# Patient Record
Sex: Female | Born: 1955 | Race: Black or African American | Hispanic: No | Marital: Married | Smoking: Former smoker
Health system: Southern US, Community
[De-identification: ages and names within clinical notes are randomized; demographics above are authoritative.]

## PROBLEM LIST (undated history)

## (undated) DIAGNOSIS — I10 Essential (primary) hypertension: Secondary | ICD-10-CM

## (undated) DIAGNOSIS — K219 Gastro-esophageal reflux disease without esophagitis: Secondary | ICD-10-CM

## (undated) DIAGNOSIS — C50919 Malignant neoplasm of unspecified site of unspecified female breast: Secondary | ICD-10-CM

## (undated) DIAGNOSIS — F419 Anxiety disorder, unspecified: Secondary | ICD-10-CM

## (undated) DIAGNOSIS — C649 Malignant neoplasm of unspecified kidney, except renal pelvis: Secondary | ICD-10-CM

## (undated) HISTORY — DX: Essential (primary) hypertension: I10

## (undated) HISTORY — DX: Malignant neoplasm of unspecified site of unspecified female breast: C50.919

## (undated) HISTORY — DX: Malignant neoplasm of unspecified kidney, except renal pelvis: C64.9

## (undated) HISTORY — PX: ABDOMINAL HYSTERECTOMY: SHX81

---

## 1997-07-26 ENCOUNTER — Other Ambulatory Visit: Admission: RE | Admit: 1997-07-26 | Discharge: 1997-07-26 | Payer: Self-pay | Admitting: Obstetrics and Gynecology

## 1997-08-05 ENCOUNTER — Emergency Department (HOSPITAL_COMMUNITY): Admission: EM | Admit: 1997-08-05 | Discharge: 1997-08-05 | Payer: Self-pay | Admitting: Emergency Medicine

## 1998-01-23 ENCOUNTER — Ambulatory Visit (HOSPITAL_COMMUNITY): Admission: RE | Admit: 1998-01-23 | Discharge: 1998-01-23 | Payer: Self-pay | Admitting: General Surgery

## 1998-01-23 ENCOUNTER — Encounter (HOSPITAL_BASED_OUTPATIENT_CLINIC_OR_DEPARTMENT_OTHER): Payer: Self-pay | Admitting: General Surgery

## 1998-02-01 ENCOUNTER — Ambulatory Visit (HOSPITAL_COMMUNITY): Admission: RE | Admit: 1998-02-01 | Discharge: 1998-02-01 | Payer: Self-pay | Admitting: General Surgery

## 1998-02-01 ENCOUNTER — Encounter (HOSPITAL_BASED_OUTPATIENT_CLINIC_OR_DEPARTMENT_OTHER): Payer: Self-pay | Admitting: General Surgery

## 1998-02-12 ENCOUNTER — Ambulatory Visit (HOSPITAL_COMMUNITY): Admission: RE | Admit: 1998-02-12 | Discharge: 1998-02-12 | Payer: Self-pay | Admitting: General Surgery

## 1998-02-12 ENCOUNTER — Encounter (HOSPITAL_BASED_OUTPATIENT_CLINIC_OR_DEPARTMENT_OTHER): Payer: Self-pay | Admitting: General Surgery

## 1998-02-23 DIAGNOSIS — C50919 Malignant neoplasm of unspecified site of unspecified female breast: Secondary | ICD-10-CM

## 1998-02-23 HISTORY — DX: Malignant neoplasm of unspecified site of unspecified female breast: C50.919

## 1998-02-23 HISTORY — PX: BREAST LUMPECTOMY: SHX2

## 1998-03-04 ENCOUNTER — Encounter (HOSPITAL_BASED_OUTPATIENT_CLINIC_OR_DEPARTMENT_OTHER): Payer: Self-pay | Admitting: General Surgery

## 1998-03-06 ENCOUNTER — Encounter (HOSPITAL_BASED_OUTPATIENT_CLINIC_OR_DEPARTMENT_OTHER): Payer: Self-pay | Admitting: General Surgery

## 1998-03-06 ENCOUNTER — Ambulatory Visit (HOSPITAL_COMMUNITY): Admission: RE | Admit: 1998-03-06 | Discharge: 1998-03-06 | Payer: Self-pay | Admitting: General Surgery

## 1998-03-27 ENCOUNTER — Encounter: Admission: RE | Admit: 1998-03-27 | Discharge: 1998-06-25 | Payer: Self-pay | Admitting: *Deleted

## 1998-06-17 ENCOUNTER — Inpatient Hospital Stay (HOSPITAL_COMMUNITY): Admission: RE | Admit: 1998-06-17 | Discharge: 1998-06-19 | Payer: Self-pay | Admitting: Obstetrics and Gynecology

## 1998-07-19 ENCOUNTER — Emergency Department (HOSPITAL_COMMUNITY): Admission: EM | Admit: 1998-07-19 | Discharge: 1998-07-19 | Payer: Self-pay | Admitting: Emergency Medicine

## 1998-07-19 ENCOUNTER — Encounter: Payer: Self-pay | Admitting: Emergency Medicine

## 1998-09-30 ENCOUNTER — Ambulatory Visit (HOSPITAL_COMMUNITY): Admission: RE | Admit: 1998-09-30 | Discharge: 1998-09-30 | Payer: Self-pay | Admitting: Family Medicine

## 1998-09-30 ENCOUNTER — Encounter: Payer: Self-pay | Admitting: Family Medicine

## 1998-10-07 ENCOUNTER — Emergency Department (HOSPITAL_COMMUNITY): Admission: EM | Admit: 1998-10-07 | Discharge: 1998-10-07 | Payer: Self-pay | Admitting: Emergency Medicine

## 1999-01-09 ENCOUNTER — Ambulatory Visit (HOSPITAL_COMMUNITY): Admission: RE | Admit: 1999-01-09 | Discharge: 1999-01-09 | Payer: Self-pay | Admitting: Family Medicine

## 1999-01-09 ENCOUNTER — Encounter: Payer: Self-pay | Admitting: Family Medicine

## 1999-02-27 ENCOUNTER — Encounter: Payer: Self-pay | Admitting: Hematology and Oncology

## 1999-02-27 ENCOUNTER — Encounter: Admission: RE | Admit: 1999-02-27 | Discharge: 1999-02-27 | Payer: Self-pay | Admitting: Hematology and Oncology

## 1999-05-21 ENCOUNTER — Emergency Department (HOSPITAL_COMMUNITY): Admission: EM | Admit: 1999-05-21 | Discharge: 1999-05-21 | Payer: Self-pay | Admitting: *Deleted

## 1999-06-12 ENCOUNTER — Emergency Department (HOSPITAL_COMMUNITY): Admission: EM | Admit: 1999-06-12 | Discharge: 1999-06-12 | Payer: Self-pay | Admitting: Emergency Medicine

## 1999-06-26 ENCOUNTER — Encounter: Admission: RE | Admit: 1999-06-26 | Discharge: 1999-06-26 | Payer: Self-pay | Admitting: Hematology and Oncology

## 1999-06-26 ENCOUNTER — Encounter: Payer: Self-pay | Admitting: Hematology and Oncology

## 1999-07-30 ENCOUNTER — Encounter: Payer: Self-pay | Admitting: Emergency Medicine

## 1999-07-30 ENCOUNTER — Emergency Department (HOSPITAL_COMMUNITY): Admission: EM | Admit: 1999-07-30 | Discharge: 1999-07-30 | Payer: Self-pay | Admitting: Emergency Medicine

## 2000-02-05 ENCOUNTER — Encounter: Admission: RE | Admit: 2000-02-05 | Discharge: 2000-02-05 | Payer: Self-pay | Admitting: Hematology and Oncology

## 2000-02-05 ENCOUNTER — Encounter: Payer: Self-pay | Admitting: Hematology and Oncology

## 2000-07-05 ENCOUNTER — Encounter: Payer: Self-pay | Admitting: Emergency Medicine

## 2000-07-05 ENCOUNTER — Emergency Department (HOSPITAL_COMMUNITY): Admission: EM | Admit: 2000-07-05 | Discharge: 2000-07-06 | Payer: Self-pay | Admitting: Emergency Medicine

## 2000-07-06 ENCOUNTER — Other Ambulatory Visit: Admission: RE | Admit: 2000-07-06 | Discharge: 2000-07-06 | Payer: Self-pay | Admitting: Diagnostic Radiology

## 2000-07-06 ENCOUNTER — Encounter: Admission: RE | Admit: 2000-07-06 | Discharge: 2000-07-06 | Payer: Self-pay | Admitting: Family Medicine

## 2000-07-06 ENCOUNTER — Encounter: Payer: Self-pay | Admitting: Family Medicine

## 2000-07-06 ENCOUNTER — Encounter (INDEPENDENT_AMBULATORY_CARE_PROVIDER_SITE_OTHER): Payer: Self-pay | Admitting: Specialist

## 2000-08-06 ENCOUNTER — Other Ambulatory Visit: Admission: RE | Admit: 2000-08-06 | Discharge: 2000-08-06 | Payer: Self-pay | Admitting: Obstetrics and Gynecology

## 2000-08-10 ENCOUNTER — Encounter: Admission: RE | Admit: 2000-08-10 | Discharge: 2000-08-10 | Payer: Self-pay | Admitting: Family Medicine

## 2000-08-10 ENCOUNTER — Encounter: Payer: Self-pay | Admitting: Family Medicine

## 2000-11-05 ENCOUNTER — Emergency Department (HOSPITAL_COMMUNITY): Admission: EM | Admit: 2000-11-05 | Discharge: 2000-11-06 | Payer: Self-pay | Admitting: *Deleted

## 2001-01-14 ENCOUNTER — Encounter: Admission: RE | Admit: 2001-01-14 | Discharge: 2001-01-14 | Payer: Self-pay | Admitting: Hematology and Oncology

## 2001-01-14 ENCOUNTER — Encounter: Payer: Self-pay | Admitting: Hematology and Oncology

## 2001-10-14 ENCOUNTER — Other Ambulatory Visit: Admission: RE | Admit: 2001-10-14 | Discharge: 2001-10-14 | Payer: Self-pay | Admitting: Obstetrics and Gynecology

## 2002-01-16 ENCOUNTER — Encounter: Admission: RE | Admit: 2002-01-16 | Discharge: 2002-01-16 | Payer: Self-pay | Admitting: Family Medicine

## 2002-01-16 ENCOUNTER — Encounter: Payer: Self-pay | Admitting: Family Medicine

## 2002-03-03 ENCOUNTER — Encounter: Payer: Self-pay | Admitting: Family Medicine

## 2002-03-03 ENCOUNTER — Encounter: Admission: RE | Admit: 2002-03-03 | Discharge: 2002-03-03 | Payer: Self-pay | Admitting: Family Medicine

## 2002-06-23 ENCOUNTER — Encounter: Payer: Self-pay | Admitting: Orthopedic Surgery

## 2002-06-23 ENCOUNTER — Encounter: Admission: RE | Admit: 2002-06-23 | Discharge: 2002-06-23 | Payer: Self-pay | Admitting: Orthopedic Surgery

## 2003-01-24 ENCOUNTER — Encounter: Admission: RE | Admit: 2003-01-24 | Discharge: 2003-01-24 | Payer: Self-pay | Admitting: Oncology

## 2004-01-11 ENCOUNTER — Encounter: Admission: RE | Admit: 2004-01-11 | Discharge: 2004-01-11 | Payer: Self-pay | Admitting: Obstetrics and Gynecology

## 2004-04-03 ENCOUNTER — Encounter: Admission: RE | Admit: 2004-04-03 | Discharge: 2004-04-03 | Payer: Self-pay | Admitting: Obstetrics and Gynecology

## 2004-04-25 ENCOUNTER — Ambulatory Visit: Payer: Self-pay | Admitting: Oncology

## 2004-08-09 ENCOUNTER — Emergency Department (HOSPITAL_COMMUNITY): Admission: EM | Admit: 2004-08-09 | Discharge: 2004-08-09 | Payer: Self-pay | Admitting: Emergency Medicine

## 2004-09-19 ENCOUNTER — Encounter: Admission: RE | Admit: 2004-09-19 | Discharge: 2004-09-19 | Payer: Self-pay | Admitting: General Surgery

## 2004-11-19 ENCOUNTER — Emergency Department (HOSPITAL_COMMUNITY): Admission: EM | Admit: 2004-11-19 | Discharge: 2004-11-19 | Payer: Self-pay | Admitting: Emergency Medicine

## 2005-02-26 ENCOUNTER — Emergency Department (HOSPITAL_COMMUNITY): Admission: EM | Admit: 2005-02-26 | Discharge: 2005-02-26 | Payer: Self-pay | Admitting: Emergency Medicine

## 2005-04-24 ENCOUNTER — Ambulatory Visit: Payer: Self-pay | Admitting: Oncology

## 2005-05-06 ENCOUNTER — Encounter: Admission: RE | Admit: 2005-05-06 | Discharge: 2005-05-06 | Payer: Self-pay | Admitting: Family Medicine

## 2005-05-17 ENCOUNTER — Emergency Department (HOSPITAL_COMMUNITY): Admission: EM | Admit: 2005-05-17 | Discharge: 2005-05-17 | Payer: Self-pay | Admitting: Emergency Medicine

## 2005-06-27 ENCOUNTER — Emergency Department (HOSPITAL_COMMUNITY): Admission: EM | Admit: 2005-06-27 | Discharge: 2005-06-27 | Payer: Self-pay | Admitting: Emergency Medicine

## 2005-06-30 ENCOUNTER — Ambulatory Visit: Payer: Self-pay | Admitting: Internal Medicine

## 2005-07-10 ENCOUNTER — Ambulatory Visit: Payer: Self-pay | Admitting: *Deleted

## 2005-07-27 ENCOUNTER — Emergency Department (HOSPITAL_COMMUNITY): Admission: EM | Admit: 2005-07-27 | Discharge: 2005-07-27 | Payer: Self-pay | Admitting: Emergency Medicine

## 2005-08-24 ENCOUNTER — Ambulatory Visit: Payer: Self-pay | Admitting: Family Medicine

## 2005-12-01 ENCOUNTER — Ambulatory Visit: Payer: Self-pay | Admitting: Internal Medicine

## 2006-03-25 ENCOUNTER — Ambulatory Visit: Payer: Self-pay | Admitting: Internal Medicine

## 2006-03-30 ENCOUNTER — Ambulatory Visit: Payer: Self-pay | Admitting: Internal Medicine

## 2006-04-23 ENCOUNTER — Ambulatory Visit: Payer: Self-pay | Admitting: Oncology

## 2006-04-26 LAB — COMPREHENSIVE METABOLIC PANEL
ALT: 11 U/L (ref 0–35)
AST: 14 U/L (ref 0–37)
CO2: 25 mEq/L (ref 19–32)
Calcium: 9 mg/dL (ref 8.4–10.5)
Chloride: 105 mEq/L (ref 96–112)
Creatinine, Ser: 0.84 mg/dL (ref 0.40–1.20)
Potassium: 3.6 mEq/L (ref 3.5–5.3)
Sodium: 139 mEq/L (ref 135–145)
Total Protein: 6.7 g/dL (ref 6.0–8.3)

## 2006-04-26 LAB — CBC WITH DIFFERENTIAL (CANCER CENTER ONLY)
BASO%: 0.4 % (ref 0.0–2.0)
LYMPH%: 29.1 % (ref 14.0–48.0)
MCH: 30.2 pg (ref 26.0–34.0)
MCV: 87 fL (ref 81–101)
MONO%: 12.1 % (ref 0.0–13.0)
NEUT#: 3.8 10*3/uL (ref 1.5–6.5)
Platelets: 294 10*3/uL (ref 145–400)
RDW: 13.8 % (ref 10.5–14.6)
WBC: 6.9 10*3/uL (ref 3.9–10.0)

## 2006-04-26 LAB — LACTATE DEHYDROGENASE: LDH: 135 U/L (ref 94–250)

## 2006-05-13 ENCOUNTER — Encounter: Admission: RE | Admit: 2006-05-13 | Discharge: 2006-05-13 | Payer: Self-pay | Admitting: Oncology

## 2006-11-10 ENCOUNTER — Encounter (INDEPENDENT_AMBULATORY_CARE_PROVIDER_SITE_OTHER): Payer: Self-pay | Admitting: *Deleted

## 2006-11-26 ENCOUNTER — Ambulatory Visit: Payer: Self-pay | Admitting: Internal Medicine

## 2006-11-26 LAB — CONVERTED CEMR LAB
ALT: 15 units/L (ref 0–35)
Albumin: 4.3 g/dL (ref 3.5–5.2)
BUN: 12 mg/dL (ref 6–23)
CO2: 24 meq/L (ref 19–32)
Calcium: 9.3 mg/dL (ref 8.4–10.5)
Chloride: 105 meq/L (ref 96–112)
Cholesterol: 254 mg/dL — ABNORMAL HIGH (ref 0–200)
Creatinine, Ser: 0.8 mg/dL (ref 0.40–1.20)
HDL: 75 mg/dL (ref >39)
Hemoglobin: 13.2 g/dL (ref 12.0–15.0)
Lymphocytes Relative: 41 % (ref 12–46)
Monocytes Absolute: 0.7 10*3/uL (ref 0.2–0.7)
Monocytes Relative: 13 % — ABNORMAL HIGH (ref 3–11)
Neutro Abs: 2.2 10*3/uL (ref 1.7–7.7)
Neutrophils Relative %: 42 % — ABNORMAL LOW (ref 43–77)
Potassium: 4.3 meq/L (ref 3.5–5.3)
RBC: 4.52 M/uL (ref 3.87–5.11)
Total CHOL/HDL Ratio: 3.4
WBC: 5.3 10*3/uL (ref 4.0–10.5)

## 2007-03-22 ENCOUNTER — Ambulatory Visit: Payer: Self-pay | Admitting: Internal Medicine

## 2007-04-21 ENCOUNTER — Ambulatory Visit (HOSPITAL_BASED_OUTPATIENT_CLINIC_OR_DEPARTMENT_OTHER): Payer: 59 | Admitting: Oncology

## 2007-05-02 LAB — COMPREHENSIVE METABOLIC PANEL
AST: 22 U/L (ref 0–37)
Albumin: 4.2 g/dL (ref 3.5–5.2)
BUN: 10 mg/dL (ref 6–23)
CO2: 26 mEq/L (ref 19–32)
Calcium: 9 mg/dL (ref 8.4–10.5)
Chloride: 106 mEq/L (ref 96–112)
Creatinine, Ser: 0.82 mg/dL (ref 0.40–1.20)
Glucose, Bld: 97 mg/dL (ref 70–99)
Potassium: 3.6 mEq/L (ref 3.5–5.3)

## 2007-05-02 LAB — CBC WITH DIFFERENTIAL (CANCER CENTER ONLY)
BASO#: 0 10*3/uL (ref 0.0–0.2)
EOS%: 2.4 % (ref 0.0–7.0)
Eosinophils Absolute: 0.2 10*3/uL (ref 0.0–0.5)
LYMPH%: 30.5 % (ref 14.0–48.0)
MCH: 28.9 pg (ref 26.0–34.0)
MCHC: 33.6 g/dL (ref 32.0–36.0)
MCV: 86 fL (ref 81–101)
MONO%: 9.7 % (ref 0.0–13.0)
Platelets: 297 10*3/uL (ref 145–400)
RBC: 4.21 10*6/uL (ref 3.70–5.32)

## 2007-05-17 ENCOUNTER — Ambulatory Visit: Payer: Self-pay | Admitting: Internal Medicine

## 2007-06-09 ENCOUNTER — Encounter: Admission: RE | Admit: 2007-06-09 | Discharge: 2007-06-09 | Payer: Self-pay | Admitting: Internal Medicine

## 2007-06-13 ENCOUNTER — Ambulatory Visit: Payer: Self-pay | Admitting: Internal Medicine

## 2007-09-14 ENCOUNTER — Ambulatory Visit: Payer: Self-pay | Admitting: Internal Medicine

## 2007-09-14 LAB — CONVERTED CEMR LAB
Cholesterol: 242 mg/dL — ABNORMAL HIGH (ref 0–200)
LDL Cholesterol: 144 mg/dL — ABNORMAL HIGH (ref 0–99)
Total CHOL/HDL Ratio: 3.1
Triglycerides: 95 mg/dL (ref <150)
VLDL: 19 mg/dL (ref 0–40)

## 2007-11-08 ENCOUNTER — Ambulatory Visit: Payer: Self-pay | Admitting: Internal Medicine

## 2008-10-18 ENCOUNTER — Encounter: Admission: RE | Admit: 2008-10-18 | Discharge: 2008-10-18 | Payer: Self-pay | Admitting: Infectious Diseases

## 2009-04-17 ENCOUNTER — Ambulatory Visit: Payer: Self-pay | Admitting: Internal Medicine

## 2009-05-15 ENCOUNTER — Ambulatory Visit: Payer: Self-pay | Admitting: Internal Medicine

## 2009-10-21 ENCOUNTER — Ambulatory Visit: Payer: Self-pay | Admitting: Internal Medicine

## 2009-10-21 LAB — CONVERTED CEMR LAB
ALT: 17 units/L (ref 0–35)
AST: 19 units/L (ref 0–37)
Alkaline Phosphatase: 83 units/L (ref 39–117)
BUN: 6 mg/dL (ref 6–23)
Calcium: 9.7 mg/dL (ref 8.4–10.5)
Chloride: 104 meq/L (ref 96–112)
Creatinine, Ser: 0.69 mg/dL (ref 0.40–1.20)

## 2009-10-31 ENCOUNTER — Encounter: Admission: RE | Admit: 2009-10-31 | Discharge: 2009-10-31 | Payer: Self-pay | Admitting: Internal Medicine

## 2010-09-28 ENCOUNTER — Emergency Department (HOSPITAL_COMMUNITY)
Admission: EM | Admit: 2010-09-28 | Discharge: 2010-09-28 | Disposition: A | Discharge status: Home / Self Care | Payer: Self-pay | Attending: Emergency Medicine | Admitting: Emergency Medicine

## 2010-09-28 DIAGNOSIS — IMO0002 Reserved for concepts with insufficient information to code with codable children: Secondary | ICD-10-CM | POA: Insufficient documentation

## 2010-09-28 DIAGNOSIS — Y990 Civilian activity done for income or pay: Secondary | ICD-10-CM | POA: Insufficient documentation

## 2010-09-28 DIAGNOSIS — M25519 Pain in unspecified shoulder: Secondary | ICD-10-CM | POA: Insufficient documentation

## 2010-09-28 DIAGNOSIS — Z79899 Other long term (current) drug therapy: Secondary | ICD-10-CM | POA: Insufficient documentation

## 2010-09-28 DIAGNOSIS — X500XXA Overexertion from strenuous movement or load, initial encounter: Secondary | ICD-10-CM | POA: Insufficient documentation

## 2010-09-28 DIAGNOSIS — F17200 Nicotine dependence, unspecified, uncomplicated: Secondary | ICD-10-CM | POA: Insufficient documentation

## 2010-09-28 DIAGNOSIS — I10 Essential (primary) hypertension: Secondary | ICD-10-CM | POA: Insufficient documentation

## 2010-09-28 DIAGNOSIS — IMO0001 Reserved for inherently not codable concepts without codable children: Secondary | ICD-10-CM | POA: Insufficient documentation

## 2010-10-03 ENCOUNTER — Other Ambulatory Visit (HOSPITAL_COMMUNITY): Payer: Self-pay | Admitting: Internal Medicine

## 2010-10-03 DIAGNOSIS — Z1231 Encounter for screening mammogram for malignant neoplasm of breast: Secondary | ICD-10-CM

## 2010-11-03 ENCOUNTER — Ambulatory Visit (HOSPITAL_COMMUNITY)
Admission: RE | Admit: 2010-11-03 | Discharge: 2010-11-03 | Disposition: A | Discharge status: Home / Self Care | Payer: Self-pay | Source: Ambulatory Visit | Attending: Internal Medicine | Admitting: Internal Medicine

## 2010-11-03 DIAGNOSIS — Z1231 Encounter for screening mammogram for malignant neoplasm of breast: Secondary | ICD-10-CM | POA: Insufficient documentation

## 2011-02-24 DIAGNOSIS — C50919 Malignant neoplasm of unspecified site of unspecified female breast: Secondary | ICD-10-CM

## 2011-02-24 HISTORY — DX: Malignant neoplasm of unspecified site of unspecified female breast: C50.919

## 2011-08-18 ENCOUNTER — Other Ambulatory Visit: Payer: Self-pay | Admitting: Family Medicine

## 2011-08-18 ENCOUNTER — Other Ambulatory Visit (HOSPITAL_COMMUNITY)
Admission: RE | Admit: 2011-08-18 | Discharge: 2011-08-18 | Disposition: A | Discharge status: Home / Self Care | Payer: Self-pay | Source: Ambulatory Visit | Attending: Family Medicine | Admitting: Family Medicine

## 2011-08-18 DIAGNOSIS — Z1159 Encounter for screening for other viral diseases: Secondary | ICD-10-CM | POA: Insufficient documentation

## 2011-08-18 DIAGNOSIS — Z01419 Encounter for gynecological examination (general) (routine) without abnormal findings: Secondary | ICD-10-CM | POA: Insufficient documentation

## 2011-11-19 ENCOUNTER — Other Ambulatory Visit (HOSPITAL_COMMUNITY): Payer: Self-pay | Admitting: Family Medicine

## 2011-11-19 ENCOUNTER — Other Ambulatory Visit: Payer: Self-pay | Admitting: Family Medicine

## 2011-11-19 DIAGNOSIS — Z1231 Encounter for screening mammogram for malignant neoplasm of breast: Secondary | ICD-10-CM

## 2011-12-15 ENCOUNTER — Ambulatory Visit
Admission: RE | Admit: 2011-12-15 | Discharge: 2011-12-15 | Disposition: A | Discharge status: Home / Self Care | Payer: 59 | Source: Ambulatory Visit | Attending: Family Medicine | Admitting: Family Medicine

## 2011-12-15 DIAGNOSIS — Z1231 Encounter for screening mammogram for malignant neoplasm of breast: Secondary | ICD-10-CM

## 2011-12-18 ENCOUNTER — Other Ambulatory Visit: Payer: Self-pay | Admitting: Family Medicine

## 2011-12-18 DIAGNOSIS — R928 Other abnormal and inconclusive findings on diagnostic imaging of breast: Secondary | ICD-10-CM

## 2011-12-28 ENCOUNTER — Other Ambulatory Visit: Payer: Self-pay | Admitting: Family Medicine

## 2011-12-28 ENCOUNTER — Ambulatory Visit
Admission: RE | Admit: 2011-12-28 | Discharge: 2011-12-28 | Disposition: A | Discharge status: Home / Self Care | Payer: 59 | Source: Ambulatory Visit | Attending: Family Medicine | Admitting: Family Medicine

## 2011-12-28 DIAGNOSIS — R928 Other abnormal and inconclusive findings on diagnostic imaging of breast: Secondary | ICD-10-CM

## 2011-12-29 ENCOUNTER — Other Ambulatory Visit: Payer: Self-pay | Admitting: Family Medicine

## 2011-12-29 DIAGNOSIS — C50911 Malignant neoplasm of unspecified site of right female breast: Secondary | ICD-10-CM

## 2012-01-01 ENCOUNTER — Telehealth: Payer: Self-pay | Admitting: *Deleted

## 2012-01-01 DIAGNOSIS — C50519 Malignant neoplasm of lower-outer quadrant of unspecified female breast: Secondary | ICD-10-CM | POA: Insufficient documentation

## 2012-01-01 NOTE — Telephone Encounter (Signed)
Confirmed [facility] for 01/06/12 at 0800.  Instructions and contact information given.

## 2012-01-04 ENCOUNTER — Ambulatory Visit
Admission: RE | Admit: 2012-01-04 | Discharge: 2012-01-04 | Disposition: A | Discharge status: Home / Self Care | Payer: 59 | Source: Ambulatory Visit | Attending: Family Medicine | Admitting: Family Medicine

## 2012-01-04 DIAGNOSIS — C50911 Malignant neoplasm of unspecified site of right female breast: Secondary | ICD-10-CM

## 2012-01-04 MED ORDER — GADOBENATE DIMEGLUMINE 529 MG/ML IV SOLN
11.0000 mL | Freq: Once | INTRAVENOUS | Status: AC | PRN
  Administered 2012-01-04: 11 mL via INTRAVENOUS

## 2012-01-05 ENCOUNTER — Other Ambulatory Visit: Payer: Self-pay | Admitting: Family Medicine

## 2012-01-05 DIAGNOSIS — R928 Other abnormal and inconclusive findings on diagnostic imaging of breast: Secondary | ICD-10-CM

## 2012-01-06 ENCOUNTER — Encounter: Payer: Self-pay | Admitting: *Deleted

## 2012-01-06 ENCOUNTER — Encounter: Payer: Self-pay | Admitting: Oncology

## 2012-01-06 ENCOUNTER — Ambulatory Visit (HOSPITAL_BASED_OUTPATIENT_CLINIC_OR_DEPARTMENT_OTHER): Payer: 59 | Admitting: Oncology

## 2012-01-06 ENCOUNTER — Other Ambulatory Visit: Payer: 59 | Admitting: Lab

## 2012-01-06 ENCOUNTER — Ambulatory Visit
Admission: RE | Admit: 2012-01-06 | Discharge: 2012-01-06 | Disposition: A | Discharge status: Home / Self Care | Payer: 59 | Source: Ambulatory Visit | Attending: Radiation Oncology | Admitting: Radiation Oncology

## 2012-01-06 ENCOUNTER — Telehealth: Payer: Self-pay | Admitting: Oncology

## 2012-01-06 ENCOUNTER — Ambulatory Visit: Payer: 59

## 2012-01-06 ENCOUNTER — Ambulatory Visit (HOSPITAL_BASED_OUTPATIENT_CLINIC_OR_DEPARTMENT_OTHER): Payer: 59 | Admitting: General Surgery

## 2012-01-06 ENCOUNTER — Ambulatory Visit: Payer: 59 | Attending: General Surgery | Admitting: Physical Therapy

## 2012-01-06 VITALS — BP 146/83 | HR 81 | Temp 97.6°F | Resp 20 | Ht 60.5 in | Wt 122.6 lb

## 2012-01-06 DIAGNOSIS — C50919 Malignant neoplasm of unspecified site of unspecified female breast: Secondary | ICD-10-CM

## 2012-01-06 DIAGNOSIS — C50519 Malignant neoplasm of lower-outer quadrant of unspecified female breast: Secondary | ICD-10-CM

## 2012-01-06 DIAGNOSIS — IMO0001 Reserved for inherently not codable concepts without codable children: Secondary | ICD-10-CM | POA: Insufficient documentation

## 2012-01-06 DIAGNOSIS — R293 Abnormal posture: Secondary | ICD-10-CM | POA: Insufficient documentation

## 2012-01-06 DIAGNOSIS — C50419 Malignant neoplasm of upper-outer quadrant of unspecified female breast: Secondary | ICD-10-CM

## 2012-01-06 DIAGNOSIS — Z853 Personal history of malignant neoplasm of breast: Secondary | ICD-10-CM

## 2012-01-06 DIAGNOSIS — Z170 Estrogen receptor positive status [ER+]: Secondary | ICD-10-CM

## 2012-01-06 LAB — CBC WITH DIFFERENTIAL/PLATELET
Basophils Absolute: 0 10*3/uL (ref 0.0–0.1)
Eosinophils Absolute: 0.2 10*3/uL (ref 0.0–0.5)
HCT: 38.2 % (ref 34.8–46.6)
HGB: 13.3 g/dL (ref 11.6–15.9)
MCH: 30.7 pg (ref 25.1–34.0)
MCV: 88.4 fL (ref 79.5–101.0)
MONO%: 13.1 % (ref 0.0–14.0)
NEUT#: 2.3 10*3/uL (ref 1.5–6.5)
NEUT%: 41.3 % (ref 38.4–76.8)
RDW: 13.6 % (ref 11.2–14.5)

## 2012-01-06 LAB — COMPREHENSIVE METABOLIC PANEL (CC13)
Albumin: 3.7 g/dL (ref 3.5–5.0)
BUN: 9 mg/dL (ref 7.0–26.0)
CO2: 26 mEq/L (ref 22–29)
Glucose: 87 mg/dl (ref 70–99)
Potassium: 3.7 mEq/L (ref 3.5–5.1)
Sodium: 141 mEq/L (ref 136–145)
Total Bilirubin: 0.2 mg/dL (ref 0.20–1.20)
Total Protein: 7.7 g/dL (ref 6.4–8.3)

## 2012-01-06 NOTE — Progress Notes (Signed)
Ms Kerri Carpenter was seen in breast clinic today.  She has a history of right breast cancer in 2000.  She received right lumpectomy and radiation. She did not tolerate antiestrogen therapy.  She now has an ipsilateral recurrence. Due to prior radiation, she requires a mastectomy on that side.  I met with her and her husband and explained this to them.  We also discussed the possible complications of reconstruction due to prior radiation as well as the options she may have regardin qutologus vs. Implant reconstruction. I have not scheduled follow up with me.

## 2012-01-06 NOTE — Patient Instructions (Signed)
Please stop smoking. We will call you to schedule your surgery.

## 2012-01-06 NOTE — Telephone Encounter (Signed)
gve the pt her dec 2013 appt calendar °

## 2012-01-06 NOTE — Progress Notes (Signed)
Checked in new patient. Gave [facility] form to fill out. She didn't receive in mail. No financial issues.

## 2012-01-06 NOTE — Progress Notes (Signed)
Patient ID: Kerri Carpenter, female   DOB: 01/11/1956, 56 y.o.   MRN: [id number]  No chief complaint on file.   HPI Kerri Carpenter is a 56 y.o. female.   HPI  She is referred by Dr. Eagle because of newly diagnosed right breast cancer. She is noted to have a questionable mass in the right lower quadrant area on her mammogram. Image guided biopsy of this area was performed and demonstrated invasive right breast cancer. MRI demonstrates an irregular enhancing mass in that area- lower outer region- and measuring 1.2 cm. There was also a suspicious mass in the lateral aspect of the left breast. She has been recalled to do a biopsy on the left breast mass. The right breast cancer is hormone receptor positive. Of note is that she's had a previous right breast cancer and underwent a right breast lumpectomy and right axillary sentinel lymph node biopsy in 2000 for a stage I right breast cancer. She was treated with radiation therapy as well as tamoxifen. She did not tolerate and aromatase inhibitor at that time. She has no breast complaints. Age at menarche was 15. Age at first child birth was 18. She had a hysterectomy 13 years ago. She has not taken hormone replacement therapy.  Past Medical History  Diagnosis Date  . Hypertension   . Breast cancer     Previous Right breast cancer 2000    Past Surgical History  Procedure Date  . Abdominal hysterectomy   . Breast lumpectomy 2000    Laparoscopic bilateral tubal ligation  No family history on file.  Social History History  Substance Use Topics  . Smoking status: Current Every Day Smoker  . Smokeless tobacco: Not on file  . Alcohol Use: Yes    Allergies  Allergen Reactions  . Aspirin Other (See Comments)    Upset stomach  . Penicillins     Current Outpatient Prescriptions  Medication Sig Dispense Refill  . lisinopril-hydrochlorothiazide (PRINZIDE,ZESTORETIC) 10-12.5 MG per tablet Take 1 tablet by mouth daily.        Review of  Systems Review of Systems  Constitutional: Negative.   HENT: Negative.   Eyes:       Wears glasses.  Respiratory: Negative.   Cardiovascular: Negative.   Gastrointestinal: Negative.   Genitourinary: Negative.   Musculoskeletal: Negative.   Neurological: Negative.   Hematological: Negative.     There were no vitals taken for this visit.  Physical Exam Physical Exam  Constitutional: She appears well-developed and well-nourished. No distress.  HENT:  Head: Normocephalic and atraumatic.  Eyes: EOM are normal. No scleral icterus.  Neck: Neck supple.  Cardiovascular: Normal rate and regular rhythm.   Pulmonary/Chest: Effort normal and breath sounds normal.       Right breast-scar at 1:00 area. A small tattoo mark present. No palpable masses.  Left breast-no palpable masses or suspicious changes.  Abdominal: Soft. She exhibits no distension and no mass. There is no tenderness.  Musculoskeletal: Normal range of motion. She exhibits no edema.       No lymphedema. No axillary or supraclavicular adenopathy.  Lymphadenopathy:    She has no cervical adenopathy.  Neurological: She is alert.  Skin: Skin is warm and dry.  Psychiatric: She has a normal mood and affect. Her behavior is normal.    Data Reviewed Imaging studies and pathology. Old chart.  Assessment    Newly diagnosed invasive right breast cancer. She's had a previous malignancy on this side 13 years ago and   was treated with breast conservation therapy. She is interested in having bilateral mastectomies. She has a lesion, on MRI, in the left breast which is pending a biopsy.    Plan    Bilateral mastectomies with right axillary sentinel lymph node biopsy possible axillary dissection if the sentinel lymph node cannot be detected given previous surgery. If the left sided breast lesion is also malignant, I recommended a left axillary sentinel lymph node biopsy as well. If the lesion is benign, she would not need a sentinel  lymph node biopsy.  She is interested in reconstruction so a referral for plastic surgery consultation will be made. We have discussed smoking cessation as well.  I have explained the procedure, risks, and aftercare to her.  Risks include but are not limited to bleeding, infection, wound problems, anesthesia, chronic chest wall pain, nerve injury, seroma formation, lymphedema.  She seems to understand and agrees with the plan.       Kerri Carpenter J 01/06/2012, 10:28 AM    

## 2012-01-06 NOTE — Progress Notes (Signed)
Kerri Carpenter [id number] 06/04/1955 56 y.o. 01/06/2012 9:44 AM  CC Dr. Avel Peace Dr. Lurline Hare  REASON FOR CONSULTATION:  56 year old female with second primary breast cancer in the right breast Patient was seen in the Multidisciplinary Breast Clinic for discussion of her treatment options.   STAGE:   Cancer of lower-outer quadrant of female breast   Primary site: Breast (Right)   Staging method: AJCC 7th Edition   Clinical: Stage IA (T1c, N0, cM0)   Summary: Stage IA (T1c, N0, cM0)  REFERRING PHYSICIAN: Dr. Avel Peace  HISTORY OF PRESENT ILLNESS:  Kerri Carpenter is a 56 y.o. female.  Patient's prior history is significant for having right breast cancer which time she underwent a lumpectomy and right axillary lymph node biopsy in 2000 for stage I disease. She completed all of her therapy. Now she found a mass in the right lower quadrant area on her mammogram she had a biopsy performed that showed invasive ductal carcinoma. MRI showed irregular enhancing mass in the lower outer region measuring 1.2 cm. There was also noted a suspicious mass in the lateral aspect of the left breast. Patient's biopsy showed an invasive ductal carcinoma that was ER positive PR positive proliferation marker 5% and HER-2/neu negative. Patient is without any complaints. She is seen in the multidisciplinary breast clinic for discussion of treatment options. Her radiology and pathology were reviewed in the multidisciplinary breast conference. Treatment options were discussed with her based on NCCN guidelines for early stage breast cancer.   Past Medical History: Past Medical History  Diagnosis Date  . Hypertension   . Breast cancer     Previous Right breast cancer 2000    Past Surgical History: Past Surgical History  Procedure Date  . Abdominal hysterectomy   . Breast lumpectomy 2000    Family History: History reviewed. No pertinent family history.  Social  History History  Substance Use Topics  . Smoking status: Current Every Day Smoker  . Smokeless tobacco: Not on file  . Alcohol Use: Yes    Allergies: Allergies  Allergen Reactions  . Aspirin Other (See Comments)    Upset stomach  . Penicillins     Current Medications: Current Outpatient Prescriptions  Medication Sig Dispense Refill  . lisinopril-hydrochlorothiazide (PRINZIDE,ZESTORETIC) 10-12.5 MG per tablet Take 1 tablet by mouth daily.        OB/GYN History: Menarche at age 52 she is postmenopausal with a hysterectomy 13 years ago no history of hormone replacement therapy first live birth was at 73.  Fertility Discussion:N/A Prior History of Cancer: Prior history of right breast cancer in 2000 she underwent a lumpectomy with sentinel lymph node biopsy and antiestrogen therapy for a total of 5 years.  Health Maintenance:  Colonoscopy August 2013 Bone Density no Last PAP smear 2013  ECOG PERFORMANCE STATUS: 0 - Asymptomatic  Genetic Counseling/testing: Patient will be referred to genetic counseling and testing  REVIEW OF SYSTEMS:  A comprehensive review of systems was negative.  PHYSICAL EXAMINATION: Blood pressure 146/83, pulse 81, temperature 97.6 F (36.4 C), resp. rate 20, height 5' 0.5" (1.537 m), weight 122 lb 9.6 oz (55.611 kg). Patient is awake alert well-developed well-nourished female in no acute distress HEENT exam EOMI PERRLA sclerae anicteric no conjunctival pallor oral mucosa is moist neck is supple lungs are clear cardiovascular is regular rate rhythm no murmurs abdomen is soft nontender no HSM extremities no edema neuro patient's alert oriented otherwise nonfocal left breast no masses nipple discharge right breast reveals  well-healed surgical scar from prior lumpectomy and a palpable mass in the lower quadrant.  STUDIES/RESULTS: US Breast Right  12/28/2011  *RADIOLOGY REPORT*  Clinical Data:  The patient returns for evaluation of a possible mass in the  right lower outer quadrant posteriorly noted on recent screening study dated 12/15/2011.  DIGITAL DIAGNOSTIC RIGHT LIMITED MAMMOGRAM  AND RIGHT BREAST ULTRASOUND:  Comparison:  11/03/2010, 10/31/2009, 10/18/2008  Findings:  Additional views confirm the presence of an ill defined density in the right lower outer quadrant posteriorly at approximately 8 o'clock.  On physical exam, no mass is palpated in the right lower outer quadrant.  Ultrasound is performed, showing an irregular hypoechoic mass with posterior shadowing that is taller than wide at 8 o'clock 5 cm from the left nipple. This measures 7 x 7 x 6 mm.  No abnormal nodes are noted in the right axilla.  The appearance is highly suspicious for invasive mammary carcinoma. Biopsy is recommended.  Ultrasound-guided core needle biopsy was discussed with the patient and she agreed with this plan.  IMPRESSION: Highly suspicious mass at 8 o'clock 5 cm from the right nipple.  RECOMMENDATION: Ultrasound-guided core needle biopsy is recommended.  This will be performed and reported separately.  I have discussed the findings and recommendations with the patient. Results were also provided in writing at the conclusion of the visit.  Report was telephoned to Dr. Tedra Sen[place] nurse, Cordelia Pen.  BI-RADS CATEGORY 5:  Highly suggestive of malignancy - appropriate action should be taken.   Original Report Authenticated By: Cain Saupe, M.D.    Mr Breast Bilateral W Wo Contrast  01/04/2012  *RADIOLOGY REPORT*  Clinical Data: Recently diagnosed grade 1 invasive ductal carcinoma of the right breast.  History of right breast cancer status post lumpectomy in 2000.  History of a benign ultrasound guided core biopsy of the left breast in 2002.  BUN and creatinine were obtained on site at [facility] at [address] Results:  BUN 5 mg/dL,  Creatinine 0.8 mg/dL.  BILATERAL BREAST MRI WITH AND WITHOUT CONTRAST  Technique: Multiplanar, multisequence MR images of both breasts  were obtained prior to and following the intravenous administration of 11ml of multihance.  Three dimensional images were evaluated at the independent DynaCad workstation.  Comparison:  With priors  Findings: There is a moderate background parenchymal enhancement pattern.  Prior lumpectomy changes are seen in the upper inner quadrant of the right breast.  There is an irregular, enhancing mass in the posterior third of the lower outer of the right breast measuring 1.2 x 0.8 x 0.8.  It is associated with a biopsy clip artifact.  No other areas of abnormal enhancement are seen in the right breast.  In the posterior third of the lower outer quadrant of the left breast there is a 1 x 0.7 x 0.7 cm enhancing mass.  There is a signal in the upper outer quadrant of the left breast corresponding with a biopsy clip artifact.  There is no enlarged axillary or internal mammary adenopathy.  IMPRESSION:  1.  1.2 cm mass in the lower outer quadrant of the right breast, corresponding with the known malignancy. 2.  Suspicious mass in the lateral aspect of the left breast.  RECOMMENDATION: Ultrasound of the left breast is recommended.  If the lesion in the lateral aspect of the left breast is seen sonographically I would recommend an ultrasound-guided core biopsy.  The patient be recalled for the additional imaging.  THREE-DIMENSIONAL MR IMAGE RENDERING ON INDEPENDENT  WORKSTATION:  Three-dimensional MR images were rendered by post-processing of the original MR data on an independent workstation.  The three- dimensional MR images were interpreted, and findings were reported in the accompanying complete MRI report for this study.  BI-RADS CATEGORY 4:  Suspicious abnormality - biopsy should be considered.   Original Report Authenticated By: Baird Lyons, M.D.    [place] Core Biopsy  12/29/2011  *RADIOLOGY REPORT*  Clinical Data:  Suspicious mass at 8 o'clock 5 cm from the right nipple.  ULTRASOUND GUIDED VACUUM ASSISTED CORE BIOPSY OF THE  RIGHT BREAST  The patient and I discussed the procedure of ultrasound-guided biopsy, including benefits and alternatives.  We discussed the high likelihood of a successful procedure. We discussed the risks of the procedure including infection, bleeding, tissue injury, clip migration, and inadequate sampling.  Informed written consent was given.  Using sterile technique, 2% lidocaine, ultrasound guidance, and a 12 gauge vacuum assisted needle, biopsy was performed of the mass at 8 o'clock 5 cm from the right nipple using a caudocranial approach.  At the conclusion of the procedure, a ribbon tissue marker clip was deployed into the biopsy cavity.  Follow-up 2-view mammogram was performed and dictated separately.  Histologic evaluation demonstrates grade I invasive ductal carcinoma.  This is concordant with the imaging findings.  Results were discussed with the patient by telephone at her request. Breast MRI is scheduled for 01/04/2012.  The patient is scheduled to be seen in the [facility] on 01/06/2012.  The patient's questions were answered.  She reported no complications from the procedure.  IMPRESSION: Ultrasound-guided biopsy of a mass at 8 o'clock 5 cm from the right nipple. Grade I invasive ductal carcinoma is diagnosed.  Breast MRI and [facility] scheduled.  No apparent complications.   Original Report Authenticated By: Cain Saupe, M.D.    Mm Digital Diag Ltd R  12/28/2011  *RADIOLOGY REPORT*  Clinical Data:  The patient returns for evaluation of a possible mass in the right lower outer quadrant posteriorly noted on recent screening study dated 12/15/2011.  DIGITAL DIAGNOSTIC RIGHT LIMITED MAMMOGRAM  AND RIGHT BREAST ULTRASOUND:  Comparison:  11/03/2010, 10/31/2009, 10/18/2008  Findings:  Additional views confirm the presence of an ill defined density in the right lower outer quadrant posteriorly at approximately 8 o'clock.  On  physical exam, no mass is palpated in the right lower outer quadrant.  Ultrasound is performed, showing an irregular hypoechoic mass with posterior shadowing that is taller than wide at 8 o'clock 5 cm from the left nipple. This measures 7 x 7 x 6 mm.  No abnormal nodes are noted in the right axilla.  The appearance is highly suspicious for invasive mammary carcinoma. Biopsy is recommended.  Ultrasound-guided core needle biopsy was discussed with the patient and she agreed with this plan.  IMPRESSION: Highly suspicious mass at 8 o'clock 5 cm from the right nipple.  RECOMMENDATION: Ultrasound-guided core needle biopsy is recommended.  This will be performed and reported separately.  I have discussed the findings and recommendations with the patient. Results were also provided in writing at the conclusion of the visit.  Report was telephoned to Dr. Tedra [place] nurse, Cordelia Pen.  BI-RADS CATEGORY 5:  Highly suggestive of malignancy - appropriate action should be taken.   Original Report Authenticated By: Cain Saupe, M.D.    Mm Digital Diagnostic Unilat R  12/28/2011  *RADIOLOGY REPORT*  Clinical Data:  Ultrasound-guided core needle biopsy of a mass at 8  o'clock 5 cm from the right nipple with clip placement.  DIGITAL DIAGNOSTIC RIGHT MAMMOGRAM  Comparison:  Previous exams.  Findings:  Films are performed following ultrasound guided biopsy of a mass at 8 o'clock 5 cm from the right nipple.  The ribbon clip is appropriately positioned.  IMPRESSION:  Appropriate clip placement following ultrasound-guided core needle biopsy of a mass at 8 o'clock 5 cm from the right nipple.   Original Report Authenticated By: Cain Saupe, M.D.    Mm Digital Screening  12/17/2011  *RADIOLOGY REPORT*  Clinical Data: Screening.  DIGITAL BILATERAL SCREENING MAMMOGRAM WITH CAD  Comparison:  Previous exams.  Findings: Two views of each breast demonstrate heterogeneously dense tissue.  In the right breast, a possible mass warrants further  evaluation with spot compression views and possibly ultrasound.  In the left breast, no masses or malignant type calcifications are identified.  Images were processed with CAD.  IMPRESSION: Further evaluation is suggested for possible mass in the right breast.  RECOMMENDATION: Diagnostic mammogram and possibly ultrasound of the right breast. (Code:FI-R-62M)  BI-RADS CATEGORY 0:  Incomplete.  Need additional imaging evaluation and/or prior mammograms for comparison.   Original Report Authenticated By: Patterson Hammersmith, M.D.      LABS:    Chemistry      Component Value Date/Time   NA 141 01/06/2012 0822   NA 141 10/21/2009 2010   K 3.7 01/06/2012 0822   K 3.8 10/21/2009 2010   CL 106 01/06/2012 0822   CL 104 10/21/2009 2010   CO2 26 01/06/2012 0822   CO2 28 10/21/2009 2010   BUN 9.0 01/06/2012 0822   BUN 6 10/21/2009 2010   CREATININE 0.8 01/06/2012 0822   CREATININE 0.69 10/21/2009 2010      Component Value Date/Time   CALCIUM 9.9 01/06/2012 0822   CALCIUM 9.7 10/21/2009 2010   ALKPHOS 87 01/06/2012 0822   ALKPHOS 83 10/21/2009 2010   AST 20 01/06/2012 0822   AST 19 10/21/2009 2010   ALT 20 01/06/2012 0822   ALT 17 10/21/2009 2010   BILITOT 0.20 01/06/2012 0822   BILITOT 0.4 10/21/2009 2010      Lab Results  Component Value Date   WBC 5.6 01/06/2012   HGB 13.3 01/06/2012   HCT 38.2 01/06/2012   MCV 88.4 01/06/2012   PLT 302 01/06/2012   PATHOLOGY: ADDITIONAL INFORMATION: PROGNOSTIC INDICATORS - ACIS Results IMMUNOHISTOCHEMICAL AND MORPHOMETRIC ANALYSIS BY THE AUTOMATED CELLULAR IMAGING SYSTEM (ACIS) Estrogen Receptor (Negative, <1%): 99%, STRONG STAINING INTENSITY Progesterone Receptor (Negative, <1%): 51%, STRONG STAINING INTENSITY Proliferation Marker Ki67 by M IB-1 (Low<20%): 5% All controls stained appropriately Jimmy Picket MD Pathologist, Electronic Signature ( Signed 01/01/2012) CHROMOGENIC IN-SITU HYBRIDIZATION Interpretation HER-2/NEU BY CISH - NO  AMPLIFICATION OF HER-2 DETECTED. THE RATIO OF HER-2: CEP 17 SIGNALS WAS 0.92. Reference range: Ratio: HER2:CEP17 < 1.8 - gene amplification not observed Ratio: HER2:CEP 17 1.8-2.2 - equivocal result Ratio: HER2:CEP17 > 2.2 - gene amplification observed Jimmy Picket MD Pathologist, Electronic Signature ( Signed 01/01/2012) 1 of 2 FINAL for Newt Minion F ([id number]) FINAL DIAGNOSIS Diagnosis Breast, right, needle core biopsy, 8 o'clock, 5 cm from nipple - INVASIVE DUCTAL CARCINOMA. - SEE COMMENT. Microscopic Comment The carcinoma is grade I. A breast prognostic profile will be performed and the results reported separately. The results were called to [facility] on 12/29/2011. (JBK:eps 12/29/2011) Pecola Leisure MD Pathologist, Electronic Signature (Case signed 12/29/2011) Sp  ASSESSMENT    56 year old female with second  right breast cancer with a prior history of right breast cancer in 2000. At that time she underwent breast conservation. Now she has a second primary in the lower outer quadrant. The tumor is invasive ductal carcinoma that is ER positive PR positive with Ki-67 of 5% and HER-2/neu negative. Patient is interested in bilateral mastectomies. She is quite worried that she is continuing to get breast cancers and does not want to take any chances at this time. Patient does have MRI guided biopsy pending tomorrow and we will continue to pursue.in spite of the fact that she wants to have bilateral mastectomies.    PLAN:    #1 patient will proceed with bilateral mastectomies with right axillary lymph node biopsy and possible axillary dissection. Patient will have left-sided breast lesion biopsied if it is malignant she will undergo left axillary sentinel lymph node biopsy if it is benign and she will not need a sentinel node. Patient will not require radiation therapy. I did discuss role of adjuvant antiestrogen therapy and we will discuss this further when  she returns after her surgery.     Thank you so much for allowing me to participate in the care of Kerri Carpenter. I will continue to follow up the patient with you and assist in her care.  All questions were answered. The patient knows to call the clinic with any problems, questions or concerns. We can certainly see the patient much sooner if necessary.  I spent 60 minutes counseling the patient face to face. The total time spent in the appointment was 60 minutes.   Drue Second, MD Medical/Oncology [facility] [phone] (beeper) [phone] (Office)  01/06/2012, 9:44 AM

## 2012-01-06 NOTE — Patient Instructions (Addendum)
Proceed with bilateral mastectomies with immediate reconstruction  Genetic testing  I will see you back in 1 month

## 2012-01-07 ENCOUNTER — Ambulatory Visit
Admission: RE | Admit: 2012-01-07 | Discharge: 2012-01-07 | Disposition: A | Discharge status: Home / Self Care | Payer: 59 | Source: Ambulatory Visit | Attending: Family Medicine | Admitting: Family Medicine

## 2012-01-07 ENCOUNTER — Encounter: Payer: Self-pay | Admitting: *Deleted

## 2012-01-07 ENCOUNTER — Other Ambulatory Visit: Payer: Self-pay | Admitting: Family Medicine

## 2012-01-07 DIAGNOSIS — R928 Other abnormal and inconclusive findings on diagnostic imaging of breast: Secondary | ICD-10-CM

## 2012-01-07 NOTE — Progress Notes (Signed)
Mailed after appt letter to pt. 

## 2012-01-11 ENCOUNTER — Telehealth (INDEPENDENT_AMBULATORY_CARE_PROVIDER_SITE_OTHER): Payer: Self-pay | Admitting: General Surgery

## 2012-01-11 ENCOUNTER — Telehealth: Payer: Self-pay | Admitting: *Deleted

## 2012-01-11 ENCOUNTER — Other Ambulatory Visit (INDEPENDENT_AMBULATORY_CARE_PROVIDER_SITE_OTHER): Payer: Self-pay | Admitting: General Surgery

## 2012-01-11 DIAGNOSIS — R928 Other abnormal and inconclusive findings on diagnostic imaging of breast: Secondary | ICD-10-CM

## 2012-01-11 NOTE — Telephone Encounter (Signed)
Spoke to pt concerning [facility] from 01/06/12.  Pt denies questions or concerns regarding dx or treatment care plan.  Encourage pt to call with needs.  Received verbal understanding.  Contact information given.

## 2012-01-11 NOTE — Telephone Encounter (Signed)
[facility] called to report that she spoke with Kerri Carpenter and pt did want to have Left breast MRI core BX. COuld Dr. Abbey Chatters please add order in [facility] for left breast MRI core Biopsy per Dr. Greene?/thanks/gy

## 2012-01-12 ENCOUNTER — Other Ambulatory Visit (INDEPENDENT_AMBULATORY_CARE_PROVIDER_SITE_OTHER): Payer: Self-pay | Admitting: General Surgery

## 2012-01-12 DIAGNOSIS — R928 Other abnormal and inconclusive findings on diagnostic imaging of breast: Secondary | ICD-10-CM

## 2012-01-13 ENCOUNTER — Telehealth: Payer: Self-pay | Admitting: Medical Oncology

## 2012-01-13 NOTE — Telephone Encounter (Signed)
Patient called asking "why is Dr. Abbey Chatters saying I need another MRI and mastectomy on my left breast?" Will review with MD. Pt with scheduled MR Biopsy 01/15/12 and MD appt 02/03/12.

## 2012-01-13 NOTE — Telephone Encounter (Signed)
Please tell the patient that it may be for planning of her surgery but she should call Dr. Arne Cleveland office to confirm

## 2012-01-14 ENCOUNTER — Other Ambulatory Visit: Payer: 59 | Admitting: Lab

## 2012-01-14 ENCOUNTER — Encounter: Payer: 59 | Admitting: Genetic Counselor

## 2012-01-14 NOTE — Telephone Encounter (Signed)
Per MD, notified pt her concerns should be address with Dr. Arne Cleveland office as they may pertain to surgical plans. Pt verbalized understanding

## 2012-01-15 ENCOUNTER — Ambulatory Visit
Admission: RE | Admit: 2012-01-15 | Discharge: 2012-01-15 | Disposition: A | Discharge status: Home / Self Care | Payer: 59 | Source: Ambulatory Visit | Attending: General Surgery | Admitting: General Surgery

## 2012-01-15 DIAGNOSIS — R928 Other abnormal and inconclusive findings on diagnostic imaging of breast: Secondary | ICD-10-CM

## 2012-01-15 MED ORDER — GADOBENATE DIMEGLUMINE 529 MG/ML IV SOLN
11.0000 mL | Freq: Once | INTRAVENOUS | Status: AC | PRN
  Administered 2012-01-15: 11 mL via INTRAVENOUS

## 2012-01-18 ENCOUNTER — Other Ambulatory Visit (INDEPENDENT_AMBULATORY_CARE_PROVIDER_SITE_OTHER): Payer: Self-pay | Admitting: General Surgery

## 2012-01-18 DIAGNOSIS — C50919 Malignant neoplasm of unspecified site of unspecified female breast: Secondary | ICD-10-CM

## 2012-01-19 ENCOUNTER — Encounter: Payer: Self-pay | Admitting: *Deleted

## 2012-01-19 ENCOUNTER — Encounter (INDEPENDENT_AMBULATORY_CARE_PROVIDER_SITE_OTHER): Payer: Self-pay | Admitting: General Surgery

## 2012-01-19 ENCOUNTER — Telehealth: Payer: Self-pay | Admitting: *Deleted

## 2012-01-19 NOTE — Telephone Encounter (Signed)
Patient confirmed over the phone the new date and time 

## 2012-01-19 NOTE — Progress Notes (Unsigned)
Patient ID: Kerri Carpenter, female   DOB: April 05, 1955, 56 y.o.   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  She does not want immediate reconstruction.  Will plan on bilateral mastectomies with bilateral axillary sentinel lymph node biopsies, possible right axillary lymph node dissection.  This was discussed with her and she agrees.

## 2012-01-20 ENCOUNTER — Telehealth: Payer: Self-pay | Admitting: *Deleted

## 2012-01-20 ENCOUNTER — Ambulatory Visit: Payer: 59

## 2012-01-20 ENCOUNTER — Encounter: Payer: Self-pay | Admitting: *Deleted

## 2012-01-20 ENCOUNTER — Other Ambulatory Visit: Payer: 59 | Admitting: Lab

## 2012-01-20 NOTE — Telephone Encounter (Signed)
Left voice message to inform the patient of the new date and time on 02-08-2012 starting at 12:30pm  Patient confirmed over the phone the new date and time

## 2012-01-25 ENCOUNTER — Encounter (HOSPITAL_COMMUNITY): Payer: Self-pay | Admitting: Pharmacy Technician

## 2012-01-28 ENCOUNTER — Encounter (INDEPENDENT_AMBULATORY_CARE_PROVIDER_SITE_OTHER): Payer: Self-pay

## 2012-01-28 ENCOUNTER — Telehealth: Payer: Self-pay | Admitting: Medical Oncology

## 2012-01-28 DIAGNOSIS — C50919 Malignant neoplasm of unspecified site of unspecified female breast: Secondary | ICD-10-CM

## 2012-01-28 MED ORDER — LORAZEPAM 0.5 MG PO TABS: 0.5000 mg | ORAL_TABLET | Freq: Four times a day (QID) | ORAL | Status: DC | PRN

## 2012-01-28 NOTE — Telephone Encounter (Signed)
Per MD, Called patient and informed her that a prescription, for anxiety, of Ativan 0.5 mg every 6 hours as needed has been ordered for her at her [facility] pharmacy on [address]. Patient verbally expressed understanding. All questions answered. Prescription verbally phoned in.

## 2012-01-28 NOTE — Telephone Encounter (Signed)
Patient called requesting anxiety medication "I am having really bad anxiety attacks and pain in the left breast, been only taking tylenol which only numbs it a little bit, doesn't really get rid of it and I'm having the mastectomy on the 10th."  Will review with MD.

## 2012-01-28 NOTE — Telephone Encounter (Signed)
She can have ativan 0.5 mg q 6 hours prn #60/3 refills

## 2012-01-29 ENCOUNTER — Encounter (HOSPITAL_COMMUNITY)
Admission: RE | Admit: 2012-01-29 | Discharge: 2012-01-29 | Disposition: A | Discharge status: Home / Self Care | Payer: 59 | Source: Ambulatory Visit | Attending: Anesthesiology | Admitting: Anesthesiology

## 2012-01-29 ENCOUNTER — Encounter (HOSPITAL_COMMUNITY)
Admission: RE | Admit: 2012-01-29 | Discharge: 2012-01-29 | Disposition: A | Discharge status: Home / Self Care | Payer: 59 | Source: Ambulatory Visit | Attending: General Surgery | Admitting: General Surgery

## 2012-01-29 ENCOUNTER — Encounter (HOSPITAL_COMMUNITY): Payer: Self-pay

## 2012-01-29 HISTORY — DX: Gastro-esophageal reflux disease without esophagitis: K21.9

## 2012-01-29 HISTORY — DX: Anxiety disorder, unspecified: F41.9

## 2012-01-29 LAB — COMPREHENSIVE METABOLIC PANEL
ALT: 21 U/L (ref 0–35)
Alkaline Phosphatase: 76 U/L (ref 39–117)
BUN: 9 mg/dL (ref 6–23)
CO2: 27 mEq/L (ref 19–32)
Chloride: 102 mEq/L (ref 96–112)
GFR calc Af Amer: >90 mL/min (ref >90)
GFR calc non Af Amer: >90 mL/min (ref >90)
Glucose, Bld: 102 mg/dL — ABNORMAL HIGH (ref 70–99)
Potassium: 3.6 mEq/L (ref 3.5–5.1)
Sodium: 140 mEq/L (ref 135–145)
Total Bilirubin: 0.3 mg/dL (ref 0.3–1.2)

## 2012-01-29 LAB — CBC WITH DIFFERENTIAL/PLATELET
Hemoglobin: 13.4 g/dL (ref 12.0–15.0)
Lymphocytes Relative: 37 % (ref 12–46)
Lymphs Abs: 2.3 10*3/uL (ref 0.7–4.0)
Monocytes Relative: 12 % (ref 3–12)
Neutrophils Relative %: 48 % (ref 43–77)
Platelets: 319 10*3/uL (ref 150–400)
RBC: 4.64 MIL/uL (ref 3.87–5.11)
WBC: 6.1 10*3/uL (ref 4.0–10.5)

## 2012-01-29 LAB — SURGICAL PCR SCREEN: MRSA, PCR: NEGATIVE

## 2012-01-29 LAB — TYPE AND SCREEN: Antibody Screen: NEGATIVE

## 2012-01-29 NOTE — Pre-Procedure Instructions (Addendum)
[address] Ivey Nembhard Al-Ameen  01/29/2012   Your procedure is scheduled on:  02/02/12  Report to [facility] at 530AM.  Call this number if you have problems the morning of surgery: [phone]   Remember:   Do not eat food or drink:After Midnight.  .  Take these medicines the morning of surgery with A SIP OF WATER: ativan , tylenol   Do not wear jewelry, make-up or nail polish.  Do not wear lotions, powders, or perfumes. You may not wear deodorant.  Do not shave 48 hours prior to surgery. Men may shave face and neck.  Do not bring valuables to the hospital.  Contacts, dentures or bridgework may not be worn into surgery.  Leave suitcase in the car. After surgery it may be brought to your room.  For patients admitted to the hospital, checkout time is 11:00 AM the day of discharge.   Patients discharged the day of surgery will not be allowed to drive home.  Name and phone number of your driver: Hulan Amato spouse  [phone]  Special Instructions: Shower using CHG 2 nights before surgery and the night before surgery.  If you shower the day of surgery use CHG.  Use special wash - you have one bottle of CHG for all showers.  You should use approximately 1/3 of the bottle for each shower.   Please read over the following fact sheets that you were given: Pain Booklet, Coughing and Deep Breathing, Blood Transfusion Information, MRSA Information and Surgical Site Infection Prevention

## 2012-01-29 NOTE — Progress Notes (Signed)
req'd ekg from dr Florentina Addison bland

## 2012-02-01 MED ORDER — VANCOMYCIN HCL IN DEXTROSE 1-5 GM/200ML-% IV SOLN
1000.0000 mg | INTRAVENOUS | Status: AC
  Administered 2012-02-02: 1000 mg via INTRAVENOUS

## 2012-02-01 NOTE — Consult Note (Signed)
Anesthesia chart review: Patient is a 56 year old female scheduled for bilateral mastectomies and right axillary sentinel node biopsy, possible axillary dissection for right breast cancer by Dr. Abbey Chatters on 02/02/2012.  Other history includes smoking, hypertension, anxiety, GERD, hysterectomy. She was treated for a previous right breast cancer in 2000 and is s/p lumpectomy, radiation, tamoxifen.  PCP is Dr. Renaye Rakers.  Oncologist is Dr. Drue Second.  EKG on 01/29/12 showed NSR, incomplete right BBB.    Chest x-ray on 01/29/2012 showed stable cardiopulmonary appearance with no new focal or acute abnormality identified.  Preoperative labs noted.  She will be evaluated by her assigned anesthesiologist on the day of surgery. If no significant change in her status then anticipate she can proceed as planned.  Shonna Chock, PA-C 02/01/12 1150

## 2012-02-02 ENCOUNTER — Encounter (HOSPITAL_COMMUNITY)
Admission: RE | Admit: 2012-02-02 | Discharge: 2012-02-02 | Disposition: A | Discharge status: Home / Self Care | Payer: 59 | Source: Ambulatory Visit | Attending: General Surgery | Admitting: General Surgery

## 2012-02-02 ENCOUNTER — Encounter (HOSPITAL_COMMUNITY)
Admission: RE | Disposition: A | Discharge status: Home / Self Care | Payer: Self-pay | Source: Ambulatory Visit | Attending: General Surgery

## 2012-02-02 ENCOUNTER — Ambulatory Visit (HOSPITAL_COMMUNITY)
Admission: RE | Admit: 2012-02-02 | Discharge: 2012-02-03 | Disposition: A | Discharge status: Home / Self Care | Payer: 59 | Source: Ambulatory Visit | Attending: General Surgery | Admitting: General Surgery

## 2012-02-02 ENCOUNTER — Encounter (HOSPITAL_COMMUNITY): Payer: Self-pay | Admitting: *Deleted

## 2012-02-02 ENCOUNTER — Encounter (HOSPITAL_COMMUNITY): Payer: Self-pay | Admitting: Vascular Surgery

## 2012-02-02 ENCOUNTER — Ambulatory Visit (HOSPITAL_COMMUNITY): Payer: 59 | Admitting: Vascular Surgery

## 2012-02-02 DIAGNOSIS — C50919 Malignant neoplasm of unspecified site of unspecified female breast: Secondary | ICD-10-CM

## 2012-02-02 DIAGNOSIS — I10 Essential (primary) hypertension: Secondary | ICD-10-CM | POA: Insufficient documentation

## 2012-02-02 DIAGNOSIS — F17200 Nicotine dependence, unspecified, uncomplicated: Secondary | ICD-10-CM | POA: Insufficient documentation

## 2012-02-02 DIAGNOSIS — Z01810 Encounter for preprocedural cardiovascular examination: Secondary | ICD-10-CM | POA: Insufficient documentation

## 2012-02-02 DIAGNOSIS — D0590 Unspecified type of carcinoma in situ of unspecified breast: Secondary | ICD-10-CM | POA: Insufficient documentation

## 2012-02-02 DIAGNOSIS — Z01812 Encounter for preprocedural laboratory examination: Secondary | ICD-10-CM | POA: Insufficient documentation

## 2012-02-02 DIAGNOSIS — Z01818 Encounter for other preprocedural examination: Secondary | ICD-10-CM | POA: Insufficient documentation

## 2012-02-02 DIAGNOSIS — Z853 Personal history of malignant neoplasm of breast: Secondary | ICD-10-CM | POA: Insufficient documentation

## 2012-02-02 DIAGNOSIS — Z170 Estrogen receptor positive status [ER+]: Secondary | ICD-10-CM | POA: Insufficient documentation

## 2012-02-02 HISTORY — PX: AXILLARY LYMPH NODE DISSECTION: SHX5229

## 2012-02-02 HISTORY — PX: AXILLARY SENTINEL NODE BIOPSY: SHX5738

## 2012-02-02 HISTORY — PX: TOTAL MASTECTOMY: SHX6129

## 2012-02-02 SURGERY — LYMPHADENECTOMY, AXILLARY
Anesthesia: General | Site: Breast | Laterality: Right | Wound class: Clean

## 2012-02-02 MED ORDER — LORAZEPAM 0.5 MG PO TABS: 0.5000 mg | ORAL_TABLET | Freq: Four times a day (QID) | ORAL | Status: DC | PRN

## 2012-02-02 MED ORDER — DEXTROSE 5 % IV SOLN
INTRAVENOUS | Status: DC | PRN
  Administered 2012-02-02: 08:00:00 via INTRAVENOUS

## 2012-02-02 MED ORDER — PROPOFOL 10 MG/ML IV BOLUS
INTRAVENOUS | Status: DC | PRN
  Administered 2012-02-02: 40 mg via INTRAVENOUS
  Administered 2012-02-02: 20 mg via INTRAVENOUS
  Administered 2012-02-02: 200 mg via INTRAVENOUS
  Administered 2012-02-02: 20 mg via INTRAVENOUS

## 2012-02-02 MED ORDER — LACTATED RINGERS IV SOLN
INTRAVENOUS | Status: DC | PRN
  Administered 2012-02-02 (×2): via INTRAVENOUS

## 2012-02-02 MED ORDER — ONDANSETRON HCL 4 MG/2ML IJ SOLN
INTRAMUSCULAR | Status: DC | PRN
  Administered 2012-02-02: 4 mg via INTRAVENOUS

## 2012-02-02 MED ORDER — LISINOPRIL 10 MG PO TABS
10.0000 mg | ORAL_TABLET | Freq: Every day | ORAL | Status: DC
  Administered 2012-02-03: 10 mg via ORAL
  Filled 2012-02-02 (×3): qty 1

## 2012-02-02 MED ORDER — ONDANSETRON HCL 4 MG PO TABS: 4.0000 mg | ORAL_TABLET | ORAL | Status: DC | PRN

## 2012-02-02 MED ORDER — TECHNETIUM TC 99M SULFUR COLLOID FILTERED
2.0000 | Freq: Once | INTRAVENOUS | Status: AC | PRN
  Administered 2012-02-02: 2 via INTRADERMAL

## 2012-02-02 MED ORDER — VANCOMYCIN HCL IN DEXTROSE 1-5 GM/200ML-% IV SOLN
1000.0000 mg | Freq: Once | INTRAVENOUS | Status: AC
  Administered 2012-02-02: 1000 mg via INTRAVENOUS
  Filled 2012-02-02: qty 200

## 2012-02-02 MED ORDER — 0.9 % SODIUM CHLORIDE (POUR BTL) OPTIME
TOPICAL | Status: DC | PRN
  Administered 2012-02-02: 1000 mL

## 2012-02-02 MED ORDER — KCL IN DEXTROSE-NACL 20-5-0.9 MEQ/L-%-% IV SOLN
INTRAVENOUS | Status: DC
  Administered 2012-02-02: 14:00:00 via INTRAVENOUS
  Filled 2012-02-02 (×3): qty 1000

## 2012-02-02 MED ORDER — GLYCOPYRROLATE 0.2 MG/ML IJ SOLN
INTRAMUSCULAR | Status: DC | PRN
  Administered 2012-02-02: 0.4 mg via INTRAVENOUS

## 2012-02-02 MED ORDER — ARTIFICIAL TEARS OP OINT
TOPICAL_OINTMENT | OPHTHALMIC | Status: DC | PRN
  Administered 2012-02-02: 1 via OPHTHALMIC

## 2012-02-02 MED ORDER — MIDAZOLAM HCL 5 MG/5ML IJ SOLN
INTRAMUSCULAR | Status: DC | PRN
  Administered 2012-02-02: 2 mg via INTRAVENOUS

## 2012-02-02 MED ORDER — OXYCODONE-ACETAMINOPHEN 5-325 MG PO TABS
1.0000 | ORAL_TABLET | ORAL | Status: DC | PRN
  Administered 2012-02-03: 2 via ORAL
  Filled 2012-02-02: qty 2

## 2012-02-02 MED ORDER — FENTANYL CITRATE 0.05 MG/ML IJ SOLN
INTRAMUSCULAR | Status: DC | PRN
  Administered 2012-02-02 (×4): 50 ug via INTRAVENOUS
  Administered 2012-02-02: 100 ug via INTRAVENOUS
  Administered 2012-02-02 (×2): 50 ug via INTRAVENOUS
  Administered 2012-02-02: 100 ug via INTRAVENOUS

## 2012-02-02 MED ORDER — SODIUM CHLORIDE 0.9 % IJ SOLN
INTRAMUSCULAR | Status: DC | PRN
  Administered 2012-02-02: 10 mL via INTRAVENOUS

## 2012-02-02 MED ORDER — NEOSTIGMINE METHYLSULFATE 1 MG/ML IJ SOLN
INTRAMUSCULAR | Status: DC | PRN
  Administered 2012-02-02: 3 mg via INTRAVENOUS

## 2012-02-02 MED ORDER — LIDOCAINE HCL 4 % MT SOLN
OROMUCOSAL | Status: DC | PRN
  Administered 2012-02-02: 4 mL via TOPICAL

## 2012-02-02 MED ORDER — HYDROMORPHONE HCL PF 1 MG/ML IJ SOLN
INTRAMUSCULAR | Status: DC | PRN
  Administered 2012-02-02: 1 mg via INTRAVENOUS

## 2012-02-02 MED ORDER — HYDROMORPHONE HCL PF 1 MG/ML IJ SOLN: 0.2500 mg | INTRAMUSCULAR | Status: DC | PRN

## 2012-02-02 MED ORDER — LISINOPRIL-HYDROCHLOROTHIAZIDE 10-12.5 MG PO TABS
1.0000 | ORAL_TABLET | Freq: Every day | ORAL | Status: DC
Start: 2012-02-02 — End: 2012-02-02

## 2012-02-02 MED ORDER — HYDROCHLOROTHIAZIDE 12.5 MG PO CAPS
12.5000 mg | ORAL_CAPSULE | Freq: Every day | ORAL | Status: DC
  Administered 2012-02-02 – 2012-02-03 (×2): 12.5 mg via ORAL
  Filled 2012-02-02 (×2): qty 1

## 2012-02-02 MED ORDER — SODIUM CHLORIDE 0.9 % IJ SOLN
INTRAMUSCULAR | Status: DC | PRN
  Administered 2012-02-02: 09:00:00

## 2012-02-02 MED ORDER — METHYLENE BLUE 1 % INJ SOLN
INTRAMUSCULAR | Status: AC
  Filled 2012-02-02: qty 10

## 2012-02-02 MED ORDER — OXYCODONE-ACETAMINOPHEN 5-325 MG PO TABS
1.0000 | ORAL_TABLET | ORAL | Status: DC | PRN
Start: 2012-02-02 — End: 2012-02-08

## 2012-02-02 MED ORDER — MORPHINE SULFATE 2 MG/ML IJ SOLN
2.0000 mg | INTRAMUSCULAR | Status: DC | PRN
  Administered 2012-02-02: 2 mg via INTRAVENOUS
  Administered 2012-02-02 – 2012-02-03 (×3): 4 mg via INTRAVENOUS
  Filled 2012-02-02 (×2): qty 2
  Filled 2012-02-02: qty 1
  Filled 2012-02-02: qty 2

## 2012-02-02 MED ORDER — ONDANSETRON HCL 4 MG/2ML IJ SOLN: 4.0000 mg | INTRAMUSCULAR | Status: DC | PRN

## 2012-02-02 MED ORDER — LIDOCAINE HCL (CARDIAC) 20 MG/ML IV SOLN
INTRAVENOUS | Status: DC | PRN
  Administered 2012-02-02: 50 mg via INTRAVENOUS

## 2012-02-02 MED ORDER — ROCURONIUM BROMIDE 100 MG/10ML IV SOLN
INTRAVENOUS | Status: DC | PRN
  Administered 2012-02-02: 10 mg via INTRAVENOUS
  Administered 2012-02-02: 30 mg via INTRAVENOUS

## 2012-02-02 MED ORDER — SUCCINYLCHOLINE CHLORIDE 20 MG/ML IJ SOLN
INTRAMUSCULAR | Status: DC | PRN
  Administered 2012-02-02: 100 mg via INTRAVENOUS

## 2012-02-02 SURGICAL SUPPLY — 54 items
APL SKNCLS STERI-STRIP NONHPOA (GAUZE/BANDAGES/DRESSINGS) ×3
APPLIER CLIP 9.375 MED OPEN (MISCELLANEOUS) ×4
APR CLP MED 9.3 20 MLT OPN (MISCELLANEOUS) ×3
BENZOIN TINCTURE PRP APPL 2/3 (GAUZE/BANDAGES/DRESSINGS) ×4 IMPLANT
BINDER BREAST LRG (GAUZE/BANDAGES/DRESSINGS) ×1 IMPLANT
BINDER BREAST MEDIUM (GAUZE/BANDAGES/DRESSINGS) ×1 IMPLANT
BINDER BREAST XLRG (GAUZE/BANDAGES/DRESSINGS) IMPLANT
BLADE SURG 15 STRL LF DISP TIS (BLADE) IMPLANT
BLADE SURG 15 STRL SS (BLADE) ×4
CANISTER SUCTION 2500CC (MISCELLANEOUS) ×4 IMPLANT
CHLORAPREP W/TINT 26ML (MISCELLANEOUS) ×4 IMPLANT
CLIP APPLIE 9.375 MED OPEN (MISCELLANEOUS) ×3 IMPLANT
CLOTH BEACON ORANGE TIMEOUT ST (SAFETY) ×4 IMPLANT
CLSR STERI-STRIP ANTIMIC 1/2X4 (GAUZE/BANDAGES/DRESSINGS) ×1 IMPLANT
CONT SPEC 4OZ CLIKSEAL STRL BL (MISCELLANEOUS) ×4 IMPLANT
COVER PROBE W GEL 5X96 (DRAPES) ×4 IMPLANT
COVER SURGICAL LIGHT HANDLE (MISCELLANEOUS) ×4 IMPLANT
DRAIN CHANNEL 19F RND (DRAIN) ×6 IMPLANT
DRAPE CHEST BREAST 15X10 FENES (DRAPES) ×4 IMPLANT
DRAPE UTILITY 15X26 W/TAPE STR (DRAPE) ×2 IMPLANT
ELECT CAUTERY BLADE 6.4 (BLADE) ×5 IMPLANT
ELECT REM PT RETURN 9FT ADLT (ELECTROSURGICAL) ×4
ELECTRODE REM PT RTRN 9FT ADLT (ELECTROSURGICAL) ×3 IMPLANT
EVACUATOR SILICONE 100CC (DRAIN) ×6 IMPLANT
GLOVE BIOGEL M STRL SZ7.5 (GLOVE) ×2 IMPLANT
GLOVE BIOGEL PI IND STRL 6.5 (GLOVE) IMPLANT
GLOVE BIOGEL PI IND STRL 8 (GLOVE) ×3 IMPLANT
GLOVE BIOGEL PI INDICATOR 6.5 (GLOVE) ×1
GLOVE BIOGEL PI INDICATOR 8 (GLOVE) ×3
GLOVE ECLIPSE 6.5 STRL STRAW (GLOVE) ×2 IMPLANT
GLOVE ECLIPSE 8.0 STRL XLNG CF (GLOVE) ×6 IMPLANT
GOWN BRE IMP PREV XXLGXLNG (GOWN DISPOSABLE) ×1 IMPLANT
GOWN STRL NON-REIN LRG LVL3 (GOWN DISPOSABLE) ×12 IMPLANT
KIT BASIN OR (CUSTOM PROCEDURE TRAY) ×4 IMPLANT
KIT ROOM TURNOVER OR (KITS) ×4 IMPLANT
NDL 18GX1X1/2 (RX/OR ONLY) (NEEDLE) IMPLANT
NDL HYPO 25GX1X1/2 BEV (NEEDLE) IMPLANT
NEEDLE 18GX1X1/2 (RX/OR ONLY) (NEEDLE) ×4 IMPLANT
NEEDLE HYPO 25GX1X1/2 BEV (NEEDLE) ×4 IMPLANT
NS IRRIG 1000ML POUR BTL (IV SOLUTION) ×4 IMPLANT
PACK GENERAL/GYN (CUSTOM PROCEDURE TRAY) ×4 IMPLANT
PAD ARMBOARD 7.5X6 YLW CONV (MISCELLANEOUS) ×4 IMPLANT
PENCIL BUTTON HOLSTER BLD 10FT (ELECTRODE) ×2 IMPLANT
SPECIMEN JAR X LARGE (MISCELLANEOUS) ×5 IMPLANT
SPONGE GAUZE 4X4 12PLY (GAUZE/BANDAGES/DRESSINGS) ×4 IMPLANT
STAPLER VISISTAT 35W (STAPLE) ×4 IMPLANT
STRIP CLOSURE SKIN 1/2X4 (GAUZE/BANDAGES/DRESSINGS) ×4 IMPLANT
SUT ETHILON 3 0 PS 1 (SUTURE) ×3 IMPLANT
SUT MNCRL AB 3-0 PS2 18 (SUTURE) ×4 IMPLANT
SUT MON AB 4-0 PC3 18 (SUTURE) ×4 IMPLANT
SUT VIC AB 3-0 SH 18 (SUTURE) ×9 IMPLANT
SYR CONTROL 10ML LL (SYRINGE) ×1 IMPLANT
TOWEL OR 17X24 6PK STRL BLUE (TOWEL DISPOSABLE) ×4 IMPLANT
TOWEL OR 17X26 10 PK STRL BLUE (TOWEL DISPOSABLE) ×4 IMPLANT

## 2012-02-02 NOTE — Anesthesia Procedure Notes (Signed)
Procedure Name: Intubation Date/Time: 02/02/2012 7:54 AM Performed by: Ellin Goodie Pre-anesthesia Checklist: Patient identified, Emergency Drugs available, Suction available, Patient being monitored and Timeout performed Patient Re-evaluated:Patient Re-evaluated prior to inductionOxygen Delivery Method: Circle system utilized Preoxygenation: Pre-oxygenation with 100% oxygen Intubation Type: IV induction Ventilation: Mask ventilation without difficulty Laryngoscope Size: Mac and 3 Grade View: Grade I Tube type: Oral Tube size: 7.5 mm Number of attempts: 1 Airway Equipment and Method: Stylet Placement Confirmation: ETT inserted through vocal cords under direct vision,  positive ETCO2 and breath sounds checked- equal and bilateral Secured at: 23 cm Tube secured with: Tape Dental Injury: Teeth and Oropharynx as per pre-operative assessment

## 2012-02-02 NOTE — Op Note (Signed)
Operative Note  Kerri Carpenter female 56 y.o. 02/02/2012  PREOPERATIVE DX:  Bilateral breast cancers (left invasive, right and DCIS)  POSTOPERATIVE DX:  Same  PROCEDURE:  1. Bilateral axillary lymphatic mapping.  2.  Left axillary sentinel lymph node biopsy. 3. Right axillary lymph node dissection. 4. Bilateral mastectomies.         Surgeon: Adolph Pollack   Assistants: Gaynelle Adu M.D.  Anesthesia: General endotracheal anesthesia  Indications: This is a 56 year old female with invasive right breast cancer discovered recently. She had a previous right breast cancer treated with a right partial mastectomy and sentinel lymph node biopsy followed by radiation. That was many years ago. 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 She chose to have bilateral mastectomies and presents for the above procedures. The procedure and risks have been discussed with her preoperatively.    Procedure Detail:  She was seen in the holding area. She had injection of radioactive material into both breasts. She was brought to the operating room placed supine on the operating table and a general anesthetic was administered. Left axillary lymphatic mapping was performed with the neoprobe an area of significant increased counts was noted in the inferior axillary area. Right axillary lymphatic mapping was performed( the site where she had a previous sentinel lymph node biopsy) and a moderate area of increased counts was noted.  The entire chest and axillary areas were sterilely prepped and draped.  An incision was made in the left axilla and the subcutaneous tissue was dissected with the cautery. The area of the axillary contents was entered. Using the neoprobe a lymph node was identified with increased counts and this was removed and sent as a sentinel lymph node. No further increased counts were noted in the axilla. The wound was inspected and hemostasis was adequate. The left axilla was  closed by approximating the subcutaneous tissues with interrupted 3-0 Vicryl sutures and then closing the skin with a running 4-0 Monocryl subcuticular stitch.  Next the right axilla was approached in a previous scar was re-incised and the dissection carried down through the sbucutaneous tissues. Using the neoprobe I noted an area of moderate counts in the medial aspect of the axilla but upon dissection could not identify a specific lymph node nor did I have significant increase in counts.  Because of this I decided to proceed with a right axillary lymph node dissection. The right axillary vein was identified. Lymphatic tissue was dissected free from its inferior aspect. The long thoracic nerve and the thoracodorsal dorsal nerve were both identified. The lymphatic tissue was removed from the area between these two nerves. I also removed lymphatic tissue from the subpectoral area. This tissue was sent to pathology as right axillary contents. The nerves were intact and functional. Inspection of the wound demonstrated adequate hemostasis. The subcutaneous tissue the wound was then approximated with interrupted 3-0 Vicryl suture. A moist sponge was placed into the subcutaneous tissue area.  Next, the left breast was approached. An elliptical incision was made through the skin of the left breast to include the nipple. Subcutaneous  flaps were created medial to the sternum, superiorly to the clavicle, laterally to the latissimus dorsi muscle, and inferiorly to the anterior rectus sheath. The breast was then dissected from the pectoralis major muscle and fascia with electrocautery. It was removed in this fashion. The medial aspect of the breast was marked with a suture and it was handed off the field.  The wound was  inspected and irrigated. Bleeding was controlled electrocautery.  A small incision was made in the inferior flap and a 19 Blake drain was introduced into the wound. The drain was anchored to the skin with  3-0 nylon suture. Once hemostasis was adequate, the subcutaneous tissue was approximated with 3-0 Vicryl sutures. The skin was closed with a running 3-0 Monocryl subcuticular stitch.  The drain was placed to bulb suction.  The right breast was approached. The previous partial mastectomy scar was in the upper inner quadrant. An elliptical incision was made to include the previous scar and the nipple. Subcutaneous flaps were raised superiorly to the clavicle, medially to the lateral border of the sternum, inferiorly to the anterior rectus sheath, and laterally to the latissimus muscle. The breast tissues and dissected off the pectoralis muscle and fascia electrocautery. The medial aspect of the left breast was marked with a suture and it was handed off the field. I then mobilized the superior and inferior flaps to allow for closure without tension. The wound was irrigated. Bleeding points were controlled electrocautery.  2 small incisions were made in the inferior flap. A 19 Blake drain was then placed into the axilla laterally. A second 19 Blake drain was placed superior to the pectoralis muscle. The drains were anchored to the skin with 3-0 nylon suture. Once hemostasis was adequate, the subcutaneous tissue was approximated with interrupted 3-0 Vicryl sutures. The skin was closed with a running 3-0 Monocryl subcuticular stitch.  The skin of the right axillary incision was then closed with a running 4-0 Monocryl subcuticular stitch.  Steri-Strips and sterile dressings were applied to all wounds.  She tolerated the procedures well without any apparent complications and was taken to the recovery room in satisfactory condition.   Estimated Blood Loss:  200 mL         Drains: JACKSON-PRATT (JP)  Blood Given: none          Specimens: Left axillary sentinel lymph node. Right axillary contents. Left breast. Right breast.        Complications:  * No complications entered in OR log *         Disposition:  PACU - hemodynamically stable.         Condition: stable

## 2012-02-02 NOTE — Interval H&P Note (Signed)
History and Physical Interval Note:  02/02/2012 7:28 AM  Kerri Carpenter  has presented today for surgery, with the diagnosis of bilateral breast cancer(the left sided lesion was high grade DCIS).  The various methods of treatment have been discussed with the patient and family. After consideration of risks, benefits and other options for treatment, the patient has consented to  Procedure(s) (LRB) with comments: MASTECTOMY WITH SENTINEL LYMPH NODE BIOPSY (Bilateral) as a surgical intervention .  The patient's history has been reviewed, patient examined, no change in status, stable for surgery.  I have reviewed the patient's chart and labs.  Questions were answered to the patient's satisfaction.     Luva Metzger Shela Commons

## 2012-02-02 NOTE — Anesthesia Postprocedure Evaluation (Signed)
  Anesthesia Post-op Note  Patient: Kerri Carpenter  Procedure(s) Performed: Procedure(s) (LRB) with comments: AXILLARY LYMPH NODE DISSECTION (Right) - Bilateral mastectomy; left axillary sentinel node biopsy; right axillary lymph node dissection.  TOTAL MASTECTOMY (Bilateral) - Bilateral mastectomy; left axillary sentinel node biopsy; right axillary lymph node dissection.  AXILLARY SENTINEL NODE BIOPSY (Left) - Bilateral mastectomy; left axillary sentinel node biopsy; right axillary lymph node dissection.   Patient Location: PACU  Anesthesia Type:General  Level of Consciousness: awake  Airway and Oxygen Therapy: Patient Spontanous Breathing  Post-op Pain: mild  Post-op Assessment: Post-op Vital signs reviewed  Post-op Vital Signs: Reviewed  Complications: No apparent anesthesia complications

## 2012-02-02 NOTE — Transfer of Care (Signed)
Immediate Anesthesia Transfer of Care Note  Patient: Kerri Carpenter  Procedure(s) Performed: Procedure(s) (LRB) with comments: AXILLARY LYMPH NODE DISSECTION (Right) - Bilateral mastectomy; left axillary sentinel node biopsy; right axillary lymph node dissection.  TOTAL MASTECTOMY (Bilateral) - Bilateral mastectomy; left axillary sentinel node biopsy; right axillary lymph node dissection.  AXILLARY SENTINEL NODE BIOPSY (Left) - Bilateral mastectomy; left axillary sentinel node biopsy; right axillary lymph node dissection.   Patient Location: PACU  Anesthesia Type:General  Level of Consciousness: awake and alert   Airway & Oxygen Therapy: Patient Spontanous Breathing  Post-op Assessment: Report given to PACU RN  Post vital signs: stable  Complications: No apparent anesthesia complications

## 2012-02-02 NOTE — H&P (View-Only) (Signed)
Patient ID: Kerri Carpenter, female   DOB: 09-11-1955, 56 y.o.   MRN: [id number]  No chief complaint on file.   HPI Kerri Carpenter is a 56 y.o. female.   HPI  She is referred by Dr. Deboraha Sprang because of newly diagnosed right breast cancer. She is noted to have a questionable mass in the right lower quadrant area on her mammogram. Image guided biopsy of this area was performed and demonstrated invasive right breast cancer. MRI demonstrates an irregular enhancing mass in that area- lower outer region- and measuring 1.2 cm. There was also a suspicious mass in the lateral aspect of the left breast. She has been recalled to do a biopsy on the left breast mass. The right breast cancer is hormone receptor positive. Of note is that she's had a previous right breast cancer and underwent a right breast lumpectomy and right axillary sentinel lymph node biopsy in 2000 for a stage I right breast cancer. She was treated with radiation therapy as well as tamoxifen. She did not tolerate and aromatase inhibitor at that time. She has no breast complaints. Age at menarche was 47. Age at first child birth was 81. She had a hysterectomy 13 years ago. She has not taken hormone replacement therapy.  Past Medical History  Diagnosis Date  . Hypertension   . Breast cancer     Previous Right breast cancer 2000    Past Surgical History  Procedure Date  . Abdominal hysterectomy   . Breast lumpectomy 2000    Laparoscopic bilateral tubal ligation  No family history on file.  Social History History  Substance Use Topics  . Smoking status: Current Every Day Smoker  . Smokeless tobacco: Not on file  . Alcohol Use: Yes    Allergies  Allergen Reactions  . Aspirin Other (See Comments)    Upset stomach  . Penicillins     Current Outpatient Prescriptions  Medication Sig Dispense Refill  . lisinopril-hydrochlorothiazide (PRINZIDE,ZESTORETIC) 10-12.5 MG per tablet Take 1 tablet by mouth daily.        Review of  Systems Review of Systems  Constitutional: Negative.   HENT: Negative.   Eyes:       Wears glasses.  Respiratory: Negative.   Cardiovascular: Negative.   Gastrointestinal: Negative.   Genitourinary: Negative.   Musculoskeletal: Negative.   Neurological: Negative.   Hematological: Negative.     There were no vitals taken for this visit.  Physical Exam Physical Exam  Constitutional: She appears well-developed and well-nourished. No distress.  HENT:  Head: Normocephalic and atraumatic.  Eyes: EOM are normal. No scleral icterus.  Neck: Neck supple.  Cardiovascular: Normal rate and regular rhythm.   Pulmonary/Chest: Effort normal and breath sounds normal.       Right breast-scar at 1:00 area. A small tattoo mark present. No palpable masses.  Left breast-no palpable masses or suspicious changes.  Abdominal: Soft. She exhibits no distension and no mass. There is no tenderness.  Musculoskeletal: Normal range of motion. She exhibits no edema.       No lymphedema. No axillary or supraclavicular adenopathy.  Lymphadenopathy:    She has no cervical adenopathy.  Neurological: She is alert.  Skin: Skin is warm and dry.  Psychiatric: She has a normal mood and affect. Her behavior is normal.    Data Reviewed Imaging studies and pathology. Old chart.  Assessment    Newly diagnosed invasive right breast cancer. She's had a previous malignancy on this side 13 years ago and  was treated with breast conservation therapy. She is interested in having bilateral mastectomies. She has a lesion, on MRI, in the left breast which is pending a biopsy.    Plan    Bilateral mastectomies with right axillary sentinel lymph node biopsy possible axillary dissection if the sentinel lymph node cannot be detected given previous surgery. If the left sided breast lesion is also malignant, I recommended a left axillary sentinel lymph node biopsy as well. If the lesion is benign, she would not need a sentinel  lymph node biopsy.  She is interested in reconstruction so a referral for plastic surgery consultation will be made. We have discussed smoking cessation as well.  I have explained the procedure, risks, and aftercare to her.  Risks include but are not limited to bleeding, infection, wound problems, anesthesia, chronic chest wall pain, nerve injury, seroma formation, lymphedema.  She seems to understand and agrees with the plan.       Amy Gothard J 01/06/2012, 10:28 AM

## 2012-02-02 NOTE — Anesthesia Preprocedure Evaluation (Addendum)
Anesthesia Evaluation  Patient identified by MRN, date of birth, ID band Patient awake, Patient confused and Patient unresponsive    Reviewed: Allergy & Precautions, H&P , NPO status , Patient's Chart, lab work & pertinent test results, reviewed documented beta blocker date and time   Airway Mallampati: I TM Distance: >3 FB     Dental  (+) Edentulous Upper and Dental Advisory Given   Pulmonary neg pulmonary ROS,  breath sounds clear to auscultation        Cardiovascular hypertension, Pt. on medications Rhythm:Regular Rate:Normal     Neuro/Psych    GI/Hepatic Neg liver ROS, GERD-  Controlled and Medicated,  Endo/Other  negative endocrine ROS  Renal/GU negative Renal ROS     Musculoskeletal   Abdominal (+)  Abdomen: soft. Bowel sounds: normal.  Peds  Hematology negative hematology ROS (+)   Anesthesia Other Findings   Reproductive/Obstetrics                       Anesthesia Physical Anesthesia Plan  ASA: III  Anesthesia Plan: General   Post-op Pain Management:    Induction: Intravenous  Airway Management Planned: Oral ETT  Additional Equipment:   Intra-op Plan:   Post-operative Plan: Possible Post-op intubation/ventilation  Informed Consent:   Dental advisory given  Plan Discussed with: CRNA and Surgeon  Anesthesia Plan Comments:         Anesthesia Quick Evaluation

## 2012-02-03 ENCOUNTER — Encounter (HOSPITAL_COMMUNITY): Payer: Self-pay | Admitting: General Surgery

## 2012-02-03 ENCOUNTER — Ambulatory Visit: Payer: 59 | Admitting: Oncology

## 2012-02-03 NOTE — Progress Notes (Signed)
Patient discharged to home with instructions. 

## 2012-02-03 NOTE — Progress Notes (Signed)
0700 Instructed patient and husband on emptying JPs. Teach back given by patient and husband.

## 2012-02-03 NOTE — Progress Notes (Signed)
1 Day Post-Op  Subjective: Feels good this morning.  Walking. Voiding.  Tolerating liquid diet.  Objective: Vital signs in last 24 hours: Temp:  [96.8 F (36 C)-98 F (36.7 C)] 97.9 F (36.6 C) (12/11 0505) Pulse Rate:  [67-94] 67  (12/11 0505) Resp:  [11-20] 20  (12/11 0505) BP: (124-170)/(58-78) 124/58 mmHg (12/11 0505) SpO2:  [97 %-100 %] 100 % (12/11 0505) Weight:  [128 lb 15.5 oz (58.5 kg)] 128 lb 15.5 oz (58.5 kg) (12/10 1245) Last BM Date: 02/01/12  Intake/Output from previous day: 12/10 0701 - 12/11 0700 In: 1820 [P.O.:120; I.V.:1700] Out: 2355 [Urine:2000; Drains:305; Blood:50] Intake/Output this shift: Total I/O In: -  Out: 1975 [Urine:1850; Drains:125]  PE: General- In NAD Chest-no significant swelling, thin serosanguinous drain output  Lab Results:  No results found for this basename: WBC:2,HGB:2,HCT:2,PLT:2 in the last 72 hours BMET No results found for this basename: NA:2,K:2,CL:2,CO2:2,GLUCOSE:2,BUN:2,CREATININE:2,CALCIUM:2 in the last 72 hours PT/INR No results found for this basename: LABPROT:2,INR:2 in the last 72 hours Comprehensive Metabolic Panel:    Component Value Date/Time   NA 140 01/29/2012 0951   NA 141 01/06/2012 0822   K 3.6 01/29/2012 0951   K 3.7 01/06/2012 0822   CL 102 01/29/2012 0951   CL 106 01/06/2012 0822   CO2 27 01/29/2012 0951   CO2 26 01/06/2012 0822   BUN 9 01/29/2012 0951   BUN 9.0 01/06/2012 0822   CREATININE 0.74 01/29/2012 0951   CREATININE 0.8 01/06/2012 0822   GLUCOSE 102* 01/29/2012 0951   GLUCOSE 87 01/06/2012 0822   CALCIUM 9.9 01/29/2012 0951   CALCIUM 9.9 01/06/2012 0822   AST 21 01/29/2012 0951   AST 20 01/06/2012 0822   ALT 21 01/29/2012 0951   ALT 20 01/06/2012 0822   ALKPHOS 76 01/29/2012 0951   ALKPHOS 87 01/06/2012 0822   BILITOT 0.3 01/29/2012 0951   BILITOT 0.20 01/06/2012 0822   PROT 7.9 01/29/2012 0951   PROT 7.7 01/06/2012 0822   ALBUMIN 3.9 01/29/2012 0951   ALBUMIN 3.7 01/06/2012 0822      Studies/Results: Nm Sentinel Node Inj-no Rpt (breast)  02/02/2012  CLINICAL DATA: bilateral breast cancer   Sulfur colloid was injected intradermally by the nuclear medicine  technologist for breast cancer sentinel node localization.      Anti-infectives: Anti-infectives     Start     Dose/Rate Route Frequency Ordered Stop   02/02/12 2000   vancomycin (VANCOCIN) IVPB 1000 mg/200 mL premix        1,000 mg 200 mL/hr over 60 Minutes Intravenous  Once 02/02/12 1242 02/02/12 2106   02/02/12 0600   vancomycin (VANCOCIN) IVPB 1000 mg/200 mL premix        1,000 mg 200 mL/hr over 60 Minutes Intravenous On call to O.R. 02/01/12 1451 02/02/12 0754          Assessment Bilateral breast cancer s/kp bilateral mastectomies and right axlnd, left slnbx on 12/10-doing well    LOS: 1 day   Plan: Discharge.  Instructions given.   Kerri Carpenter J 02/03/2012

## 2012-02-06 NOTE — Discharge Summary (Signed)
Physician Discharge Summary  Patient ID: Kerri Carpenter MRN: [id number] DOB/AGE: Jul 03, 1955 56 y.o.  Admit date: 02/02/2012 Discharge date: 02/06/2012  Admission Diagnoses:  Invasive right breast cancer; DCIS in left breast.  Discharge Diagnoses:  Invasive right breast cancer (T1N0), DCIS of left breast.    Discharged Condition: good  Hospital Course: She underwent bilateral mastectomies and left axillary sentinel lymph node dissection with right axillary lymph node dissection.  She tolerated the procedures well and was able to be discharged on POD#1.  Discharge instructions were given to her.  Consults: None  Significant Diagnostic Studies: none  Treatments: surgery: Bilateral mastectomies, left axillary sentinel lymph node biopsy, right axillary lymph node dissection.  Discharge Exam: Blood pressure 147/71, pulse 70, temperature 98.2 F (36.8 C), temperature source Oral, resp. rate 18, height 4\' 11"  (1.499 m), weight 128 lb 15.5 oz (58.5 kg), SpO2 99.00%.   Disposition: 01-Home or Self Care  Discharge Orders    Future Appointments: Provider: Department: Dept Phone: Center:   02/08/2012 12:15 PM [facility] [phone] None   02/08/2012 1:00 PM Victorino December, MD [facility] MEDICAL ONCOLOGY [phone] None   02/08/2012 4:00 PM Adolph Pollack, MD [facility], [place] [phone] None   02/15/2012 9:00 AM Linard Millers, [facility] MEDICAL ONCOLOGY [phone] None   02/15/2012 10:00 AM [facility] G[facility] [phone] None       Medication List     As of 02/06/2012  3:23 PM    TAKE these medications         acetaminophen 500 MG tablet   Commonly known as: TYLENOL   Take 1,000 mg by mouth every 6 (six) hours as needed. For pain.      lisinopril-hydrochlorothiazide 10-12.5 MG per tablet   Commonly known as: PRINZIDE,ZESTORETIC    Take 1 tablet by mouth daily.      LORazepam 0.5 MG tablet   Commonly known as: ATIVAN   Take 1 tablet (0.5 mg total) by mouth every 6 (six) hours as needed for anxiety.      oxyCODONE-acetaminophen 5-325 MG per tablet   Commonly known as: PERCOCET/ROXICET   Take 1-2 tablets by mouth every 4 (four) hours as needed for pain.         Signed: Adolph Pollack 02/06/2012, 3:23 PM

## 2012-02-08 ENCOUNTER — Telehealth: Payer: Self-pay | Admitting: *Deleted

## 2012-02-08 ENCOUNTER — Ambulatory Visit (INDEPENDENT_AMBULATORY_CARE_PROVIDER_SITE_OTHER): Payer: 59 | Admitting: General Surgery

## 2012-02-08 ENCOUNTER — Ambulatory Visit (HOSPITAL_BASED_OUTPATIENT_CLINIC_OR_DEPARTMENT_OTHER): Payer: 59 | Admitting: Oncology

## 2012-02-08 ENCOUNTER — Other Ambulatory Visit: Payer: 59

## 2012-02-08 ENCOUNTER — Encounter: Payer: Self-pay | Admitting: Oncology

## 2012-02-08 VITALS — BP 122/80 | HR 84 | Temp 97.3°F | Resp 20 | Ht 59.0 in | Wt 121.5 lb

## 2012-02-08 VITALS — BP 120/74 | HR 69 | Temp 97.3°F | Resp 20 | Ht 59.0 in | Wt 121.5 lb

## 2012-02-08 DIAGNOSIS — C50519 Malignant neoplasm of lower-outer quadrant of unspecified female breast: Secondary | ICD-10-CM

## 2012-02-08 DIAGNOSIS — Z853 Personal history of malignant neoplasm of breast: Secondary | ICD-10-CM

## 2012-02-08 DIAGNOSIS — Z9889 Other specified postprocedural states: Secondary | ICD-10-CM

## 2012-02-08 DIAGNOSIS — D0590 Unspecified type of carcinoma in situ of unspecified breast: Secondary | ICD-10-CM

## 2012-02-08 DIAGNOSIS — Z170 Estrogen receptor positive status [ER+]: Secondary | ICD-10-CM

## 2012-02-08 MED ORDER — OXYCODONE-ACETAMINOPHEN 5-325 MG PO TABS: 1.0000 | ORAL_TABLET | ORAL | Status: DC | PRN

## 2012-02-08 NOTE — Patient Instructions (Addendum)
We will see you back in 3 weeks for follow up

## 2012-02-08 NOTE — Telephone Encounter (Signed)
Gave patient appointment for 03-24-2012

## 2012-02-08 NOTE — Progress Notes (Signed)
Procedure:  Bilateral mastectomies and axillary lymph nodes biopsies.  Date:  02/02/12  Pathology:  Right breast is T1cN0, Left breast is Tis.  History:  She is here for her first postoperative visit and is doing well. She is aware of her pathology. 2 out of the 3 drains are draining less than 30 cc per day.  Exam: General- Is in NAD. Chest-bilateral chest wall incisions are clean and intact. The drain in the left chest was removed. The medial drain in the right chest was removed. A dry bandage was applied.  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 One drain remains in.  Plan:  Return visit 3 weeks. She was told to call when the drain output is less than 30 cc a day and we will see her at that time and take the drain out. She may start moderate activities.

## 2012-02-08 NOTE — Patient Instructions (Signed)
Remove left bandage in three day.  Call when drain output is less than 30 cc per day.

## 2012-02-12 ENCOUNTER — Telehealth (INDEPENDENT_AMBULATORY_CARE_PROVIDER_SITE_OTHER): Payer: Self-pay

## 2012-02-12 NOTE — Telephone Encounter (Signed)
Pt was advised to wait until Monday and then call about having her drains removed.  She decided she would wait until after Christmas.  I told her to call our office first thing Thursday morning to get on the schedule to be seen.

## 2012-02-12 NOTE — Telephone Encounter (Signed)
The pt called and states she thinks her other drain is ready to come out.  She didn't drain any on 12/16, the morning of the 17th she drained 50 cc, the morning of the 18th she drained 30cc and 20cc that pm.  On the 19th she drained 10 cc or less in the morning and 25 cc in the pm.  So far today it has been 15 cc.  Please call about making an appointment.

## 2012-02-12 NOTE — Telephone Encounter (Signed)
Close encounter 

## 2012-02-15 ENCOUNTER — Other Ambulatory Visit: Payer: Self-pay | Admitting: Oncology

## 2012-02-15 ENCOUNTER — Encounter: Payer: Self-pay | Admitting: Genetic Counselor

## 2012-02-15 ENCOUNTER — Ambulatory Visit (HOSPITAL_BASED_OUTPATIENT_CLINIC_OR_DEPARTMENT_OTHER): Payer: 59 | Admitting: Genetic Counselor

## 2012-02-15 ENCOUNTER — Other Ambulatory Visit: Payer: 59 | Admitting: Lab

## 2012-02-15 DIAGNOSIS — IMO0002 Reserved for concepts with insufficient information to code with codable children: Secondary | ICD-10-CM

## 2012-02-15 DIAGNOSIS — C50519 Malignant neoplasm of lower-outer quadrant of unspecified female breast: Secondary | ICD-10-CM

## 2012-02-15 NOTE — Progress Notes (Signed)
Dr.  Drue Second requested a consultation for genetic counseling and risk assessment for Kerri Carpenter, a 56 y.o. female, for discussion of her personal history of breast cancer. She presents to clinic today to discuss the possibility of a genetic predisposition to cancer, and to further clarify her risks, as well as her family members' risks for cancer.   HISTORY OF PRESENT ILLNESS: In 2000 and 2013, at the age of 40 and 77, Kerri Carpenter was diagnosed with invasive ductal carcinoma of the breast. This was treated with lumpectomy and radiation for her original cancer, and a double mastectomy and possible radiation for her second.    Past Medical History  Diagnosis Date  . Hypertension   . Breast cancer     Previous Right breast cancer 2000  . Anxiety     anxiety attacks recent  . GERD (gastroesophageal reflux disease)     hx    Past Surgical History  Procedure Date  . Abdominal hysterectomy   . Breast lumpectomy 2000  . Axillary lymph node dissection 02/02/2012    Procedure: AXILLARY LYMPH NODE DISSECTION;  Surgeon: Adolph Pollack, MD;  Location: [facility] OR;  Service: General;  Laterality: Right;  Bilateral mastectomy; left axillary sentinel node biopsy; right axillary lymph node dissection.   . Total mastectomy 02/02/2012    Procedure: TOTAL MASTECTOMY;  Surgeon: Adolph Pollack, MD;  Location: [facility] OR;  Service: General;  Laterality: Bilateral;  Bilateral mastectomy; left axillary sentinel node biopsy; right axillary lymph node dissection.   Marland Kitchen Axillary sentinel node biopsy 02/02/2012    Procedure: AXILLARY SENTINEL NODE BIOPSY;  Surgeon: Adolph Pollack, MD;  Location: [facility] OR;  Service: General;  Laterality: Left;  Bilateral mastectomy; left axillary sentinel node biopsy; right axillary lymph node dissection.     History  Substance Use Topics  . Smoking status: Current Every Day Smoker -- 0.5 packs/day for 39 years    Types: Cigarettes  . Smokeless tobacco: Not on  file     Comment: social drinker  . Alcohol Use: Yes    REPRODUCTIVE HISTORY AND PERSONAL RISK ASSESSMENT FACTORS: Menarche was at age 10.   Menopausal Uterus Intact: No, due to endometriosis and taking of tamoxifen after her original breast cancer Ovaries Intact: Yes G1P1A0 , first live birth at age 43  She has not previously undergone treatment for infertility.   OCP use for 7 years   She has not used HRT in the past.    FAMILY HISTORY:  We obtained a detailed, 4-generation family history.  Significant diagnoses are listed below: Family History  Problem Relation Age of Onset  . Lung cancer Paternal Aunt   The patient was diagnosed with breast cancer at ages 42 and 23.  She has two full sisters, a full brother, and a maternal half brother and sister.  None of whom have cancer.  Her father died at 38 in a MVA.  He had 11 brothers and sisters, some of whom had cancer.  The patient does not know who had cancer or the type of cancer they had.  The patient's mother is alive at 73.  She had 12 brothers and sisters, none of whom had cancer.  The patient's maternal grandfather had leukemia and died at 78.  Patient's maternal ancestors are of African American descent, and paternal ancestors are of African American descent. There is no reported Ashkenazi Jewish ancestry. There is no  known consanguinity.  GENETIC COUNSELING RISK ASSESSMENT, DISCUSSION, AND SUGGESTED  FOLLOW UP: We reviewed the natural history and genetic etiology of sporadic, familial and hereditary cancer syndromes.  About 5-10% of breast cancer is hereditary.  Of this, about 85% is the result of a BRCA1 or BRCA2 mutation.  We reviewed the red flags of hereditary cancer syndromes and the dominant inheritance patterns.  If the BRCA testing is negative, we discussed that we could be testing for the wrong gene.  We discussed gene panels, and that several cancer genes that are associated with different cancers can be tested at the same  time.    The patient's personal history of bilateral breast cancer is suggestive of the following possible diagnosis: hereditary cancer syndrome  We discussed that identification of a hereditary cancer syndrome may help her care providers tailor the patients medical management. If a mutation indicating a hereditary cancer syndrome is detected in this case, the [employer] recommendations would include increased cancer surveillance and possible prophylactic surgery. If a mutation is detected, the patient will be referred back to the referring provider and to any additional appropriate care providers to discuss the relevant options.   If a mutation is not found in the patient, this will decrease the likelihood of a hereditary cancer syndrome as the explanation for her breast cancer. Cancer surveillance options would be discussed for the patient according to the appropriate standard National Comprehensive Cancer Network and American Cancer Society guidelines, with consideration of their personal and family history risk factors. In this case, the patient will be referred back to their care providers for discussions of management.   In order to estimate her chance of having a BRCA mutation, we used statistical models (Penn II) and laboratory data that take into account her personal medical history, family history and ancestry.  Because each model is different, there can be a lot of variability in the risks they give.  Therefore, these numbers must be considered a rough range and not a precise risk of having a BRCA mutation.  These models estimate that she has approximately a 11% chance of having a mutation. Based on this assessment of her family and personal history, genetic testing is recommended.  After considering the risks, benefits, and limitations, the patient provided informed consent for  the following  testing: BRACAnalysis with My[employer] through [employer].   Per the  patient's request, we will contact her by telephone to discuss these results. A follow up genetic counseling visit will be scheduled if indicated.  The patient was seen for a total of 60 minutes, greater than 50% of which was spent face-to-face counseling.  This plan is being carried out per Dr. Feliz Beam recommendations.  This note will also be sent to the referring provider via the electronic medical record. The patient will be supplied with a summary of this genetic counseling discussion as well as educational information on the discussed hereditary cancer syndromes following the conclusion of their visit.   Patient was discussed with Dr. Pierce Crane, in Dr. Feliz Beam absence.  _______________________________________________________________________ For Office Staff:  Number of people involved in session: 2 Was an Intern/ student involved with case: no

## 2012-02-18 ENCOUNTER — Telehealth: Payer: Self-pay | Admitting: *Deleted

## 2012-02-18 ENCOUNTER — Telehealth (INDEPENDENT_AMBULATORY_CARE_PROVIDER_SITE_OTHER): Payer: Self-pay

## 2012-02-18 MED ORDER — OXYCODONE-ACETAMINOPHEN 5-325 MG PO TABS: 1.0000 | ORAL_TABLET | ORAL | Status: DC | PRN

## 2012-02-18 NOTE — Telephone Encounter (Signed)
Pt called stating she had 40cc yesterday am in drain and this morning she had 30cc in drain. Pt will monitor drainage at 8:30am tomorrow and call if below 30cc for 24 hrs. Per Dr Maris Berger recommendation if below 30cc she can come to office to have drain removed. Pt states she understands.

## 2012-02-18 NOTE — Telephone Encounter (Signed)
Reviewed pt's concern with np. vo for refill on pain medication. Notified pt Rx ready this afternoon for pick up. Pt verbalized understanding. No further questions

## 2012-02-22 ENCOUNTER — Ambulatory Visit: Payer: 59 | Admitting: Oncology

## 2012-02-22 ENCOUNTER — Other Ambulatory Visit: Payer: 59 | Admitting: Lab

## 2012-02-22 ENCOUNTER — Encounter (INDEPENDENT_AMBULATORY_CARE_PROVIDER_SITE_OTHER): Payer: Self-pay

## 2012-02-22 ENCOUNTER — Ambulatory Visit (INDEPENDENT_AMBULATORY_CARE_PROVIDER_SITE_OTHER): Payer: 59

## 2012-02-22 VITALS — BP 112/70 | HR 69 | Temp 97.6°F | Resp 16 | Wt 122.2 lb

## 2012-02-22 DIAGNOSIS — Z4803 Encounter for change or removal of drains: Secondary | ICD-10-CM

## 2012-02-22 DIAGNOSIS — Z4889 Encounter for other specified surgical aftercare: Secondary | ICD-10-CM

## 2012-02-22 NOTE — Progress Notes (Signed)
Patient returns to the office today for wound check and drain removal.  Patient reports having mild swelling in her right forearm and difficulty with extending her arm out.   Drain removed and dry gauze placed over drain site.  Patient tolerated well.  Patient has follow up appointment on 03/08/12 @ 11:40 am w/Dr. Abbey Chatters.  Patient given Post Mastectomy Exercise sheet. (s/p Bilateral Mastectomy w/SLN Axillary Lymph Node Dissection and Biopsies 02/02/12)

## 2012-02-26 ENCOUNTER — Telehealth: Payer: Self-pay | Admitting: Medical Oncology

## 2012-02-26 DIAGNOSIS — C50919 Malignant neoplasm of unspecified site of unspecified female breast: Secondary | ICD-10-CM

## 2012-02-26 MED ORDER — OXYCODONE-ACETAMINOPHEN 5-325 MG PO TABS: 1.0000 | ORAL_TABLET | ORAL | Status: DC | PRN

## 2012-02-26 NOTE — Telephone Encounter (Signed)
Informed patient her request for prescription refill of Oxycodone is ready for pick up. Patient stated she will be by office to pick it up.

## 2012-02-26 NOTE — Telephone Encounter (Signed)
Pt LVMOM requesting refill of oxycodone 5-325mg . Next sched appts with lab/MD 03/24/12. Will review with MD.

## 2012-02-26 NOTE — Telephone Encounter (Signed)
Ok to refill 

## 2012-02-28 NOTE — Progress Notes (Signed)
OFFICE PROGRESS NOTE  CC  Kerri Pitter, MD [address] [place] [address] Dr. Avel Peace  DIAGNOSIS: 57 year old female with diagnosis of bilateral breast cancers.  PRIOR THERAPY:  #1patient was originally seen in the multidisciplinary breast clinic at which time she was found to have a right-sided breast cancer T1 C. N0. 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 Because of this she went on to have bilateral mastectomies with axillary lymph node biopsies.  #2 her final pathology reveals right invasive ductal carcinoma, grade one measuring 1.4 cm ER positive nodes were negative.ER positive PR positive HER-2/neu negative with Ki-67 of 5%.  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  CURRENT THERAPY:patient is recommended antiestrogen therapies once she recovers from her surgery.  INTERVAL HISTORY: Kerri Carpenter 57 y.o. female returns for followup visit post mastectomies. Of note she has had bilateral mastectomies. She has had previous history of breast cancer back in 2000. Overall patient today feels well she is very satisfied by her surgery. She does not want reconstruction at all. She does have some pain and she is taking pain medications and a prescription is given to her again today. Remainder of the 10 point review of systems is negative.  MEDICAL HISTORY: Past Medical History  Diagnosis Date  . Hypertension   . Breast cancer     Previous Right breast cancer 2000  . Anxiety     anxiety attacks recent  . GERD (gastroesophageal reflux disease)     hx    ALLERGIES:  is allergic to aspirin and penicillins.  MEDICATIONS:  Current Outpatient Prescriptions  Medication Sig Dispense Refill  . acetaminophen (TYLENOL) 500 MG tablet Take 1,000 mg by mouth every 6 (six) hours as needed. For pain.      Marland Kitchen  lisinopril-hydrochlorothiazide (PRINZIDE,ZESTORETIC) 10-12.5 MG per tablet Take 1 tablet by mouth daily.      Marland Kitchen LORazepam (ATIVAN) 0.5 MG tablet Take 1 tablet (0.5 mg total) by mouth every 6 (six) hours as needed for anxiety.  60 tablet  3  . oxyCODONE-acetaminophen (ROXICET) 5-325 MG per tablet Take 1-2 tablets by mouth every 4 (four) hours as needed for pain.  60 tablet  0    SURGICAL HISTORY:  Past Surgical History  Procedure Date  . Abdominal hysterectomy   . Breast lumpectomy 2000  . Axillary lymph node dissection 02/02/2012    Procedure: AXILLARY LYMPH NODE DISSECTION;  Surgeon: Adolph Pollack, MD;  Location: [facility] OR;  Service: General;  Laterality: Right;  Bilateral mastectomy; left axillary sentinel node biopsy; right axillary lymph node dissection.   . Total mastectomy 02/02/2012    Procedure: TOTAL MASTECTOMY;  Surgeon: Adolph Pollack, MD;  Location: [facility] OR;  Service: General;  Laterality: Bilateral;  Bilateral mastectomy; left axillary sentinel node biopsy; right axillary lymph node dissection.   Marland Kitchen Axillary sentinel node biopsy 02/02/2012    Procedure: AXILLARY SENTINEL NODE BIOPSY;  Surgeon: Adolph Pollack, MD;  Location: [facility] OR;  Service: General;  Laterality: Left;  Bilateral mastectomy; left axillary sentinel node biopsy; right axillary lymph node dissection.     REVIEW OF SYSTEMS:  Pertinent items are noted in HPI.   HEALTH MAINTENANCE:  PHYSICAL EXAMINATION: Blood pressure 120/74, pulse 69, temperature 97.3 F (36.3 C), temperature source Oral, resp. rate 20, height 4\' 11"  (1.499  m), weight 121 lb 8 oz (55.112 kg). Body mass index is 24.54 kg/(m^2). ECOG PERFORMANCE STATUS: 1 - Symptomatic but completely ambulatory   General appearance: alert, cooperative and appears stated age Resp: clear to auscultation bilaterally Cardio: regular rate and rhythm, S1, S2 normal, no murmur, click, rub or gallop GI: soft, non-tender; bowel sounds normal; no masses,  no  organomegaly Extremities: extremities normal, atraumatic, no cyanosis or edema Neurologic: Grossly normal   LABORATORY DATA: Lab Results  Component Value Date   WBC 6.1 01/29/2012   HGB 13.4 01/29/2012   HCT 40.5 01/29/2012   MCV 87.3 01/29/2012   PLT 319 01/29/2012      Chemistry      Component Value Date/Time   NA 140 01/29/2012 0951   NA 141 01/06/2012 0822   K 3.6 01/29/2012 0951   K 3.7 01/06/2012 0822   CL 102 01/29/2012 0951   CL 106 01/06/2012 0822   CO2 27 01/29/2012 0951   CO2 26 01/06/2012 0822   BUN 9 01/29/2012 0951   BUN 9.0 01/06/2012 0822   CREATININE 0.74 01/29/2012 0951   CREATININE 0.8 01/06/2012 0822      Component Value Date/Time   CALCIUM 9.9 01/29/2012 0951   CALCIUM 9.9 01/06/2012 0822   ALKPHOS 76 01/29/2012 0951   ALKPHOS 87 01/06/2012 0822   AST 21 01/29/2012 0951   AST 20 01/06/2012 0822   ALT 21 01/29/2012 0951   ALT 20 01/06/2012 0822   BILITOT 0.3 01/29/2012 0951   BILITOT 0.20 01/06/2012 0822     REASON FOR ADDENDUM, AMENDMENT OR CORRECTION: [id number]: part 3 oncology table: TNM code should be pTis, pN0. ADDITIONAL INFORMATION: 4. CHROMOGENIC IN-SITU HYBRIDIZATION Interpretation HER-2/NEU BY CISH - NO AMPLIFICATION OF HER-2 DETECTED. THE RATIO OF HER-2: CEP 17 SIGNALS WAS 1.30. Reference range: Ratio: HER2:CEP17 < 1.8 - gene amplification not observed Ratio: HER2:CEP 17 1.8-2.2 - equivocal result Ratio: HER2:CEP17 > 2.2 - gene amplification observed Pecola Leisure MD Pathologist, Electronic Signature ( Signed 02/09/2012) FINAL DIAGNOSIS Diagnosis 1. Lymph node, sentinel, biopsy, Left axillary - ONE LYMPH NODE, NEGATIVE FOR TUMOR (0/1). 2. Lymph nodes, regional resection, Right axillary - ONE LYMPH NODE, NEGATIVE FOR TUMOR (0/1). 3. Breast, simple mastectomy, Left - HIGH GRADE DUCTAL CARCINOMA IN SITU, SEE COMMENT. - IN SITU CARCINOMA IS 1.3 CM FROM NEAREST MARGIN (DEEP). 1 of 4 Amended copy Corrected FINAL for REVELLA, SHELTON [phone]) Diagnosis(continued) 4. Breast, simple mastectomy, Right - INVASIVE DUCTAL CARCINOMA, GRADE I (1.4 CM). - INVASIVE TUMOR IS FOCALLY PRESENT AT ANTERIOR MARGIN - NO LYMPHOVASCULAR INVASION IDENTIFIED. - SEE TUMOR SYNOPTIC TEMPLATE BELOW. Microscopic Comment 3. BREAST, IN SITU CARCINOMA Specimen, including laterality: Left breast Procedure: Simple mastectomy Grade of carcinoma: III Necrosis: Present Estimated tumor size: (gross measurement or glass slide measurement): See comment Treatment effect: None If present, treatment effect in breast tissue, lymph nodes or both: N/A Distance to closest margin: 1.3 cm If margin positive, focally or broadly: N/A Breast prognostic profile: Not repeated Estrogen receptor: Previous study demonstrated 51% positivity ([id number]) Progesterone receptor: Previous study demonstrated 10% positivity ([id number]) Lymph nodes: Examined: One (part 1) Lymph nodes with metastasis: 0 TNM: pTis, pN0 Comments: Multiple foci of ductal carcinoma in situ are identified involving the 1.2 cm indurated area associated with the dumb bell shaped clip as well as the additional 2.8 cm area of induration not associated with clips (see gross description). Additional non-neoplastic findings include fibrocystic change with usual ductal hyperplasia, sclerosing adenosis, fibroadenomatoid nodules, and  microcalcifications in benign ducts and lobules. 4. BREAST, INVASIVE TUMOR, WITH LYMPH NODE SAMPLING Specimen, including laterality: Right breast Procedure: Simple mastectomy Grade: I of III Tubule formation: 1 Nuclear pleomorphism: 1 Mitotic: 1 Tumor size (gross measurement): 1.4 cm Margins: Invasive, distance to closest margin: Present, anterior margin . In-situ, distance to closest margin: n/a If margin positive, focally or broadly: Focally Lymphovascular invasion: Absent Ductal carcinoma in situ: Absent Grade: N/A Extensive intraductal component:  N/A Lobular neoplasia: Present, atypical hyperplasia Tumor focality: Unifocal Treatment effect: None If present, treatment effect in breast tissue, lymph nodes or both: N/A Extent of tumor: Skin: Negative for tumor Nipple: Negative for tumor 2 of 4 Amended copy Corrected FINAL for ZEEVA, COURSER F [phone]) Microscopic Comment(continued) Skeletal muscle: N/A Lymph nodes: # examined: 1 (part 2) Lymph nodes with metastasis: 0 Breast prognostic profile: Estrogen receptor: Not repeated, previous study demonstrated 99% positivity ([id number]) Progesterone receptor: Not repeated, previous study demonstrated 51% positivity ([id number]) Her 2 neu: Repeated, previous study demonstrate no amplification ([id number]) Ki-67: Not repeated, previous study demonstrated 5% proliferation rate ([id number]) Non-neoplastic breast: Previous biopsy site, fibrocystic change with usual ductal hyperplasia and microcalcifications in benign ducts and lobules TNM: pT1c, pN0, pMX Comments: Invasive tumor focally involves the cauterized tissue edge in slide 4A. Following review of the mastectomy with the Pathologist's Assistant who grossed the case, slide section 4A was determined to be from the anterior margin. In addition to the tumor being present at the anterior margin, invasive tumor is less than 0.1 mm from the nearest anterior margin in a separate slide section (slide F). (CR:kh 02-04-12) Pecola Leisure MD Pathologist, Electronic Signature (Case signed 02/08/2012) Specimen Gross and Clinical Information Specimen(s) Obtained: 1. Lymph node, sentinel, biopsy, Left axillary 2. Lymph nodes, regional resection, Right axillary 3. Breast, simple mastectomy, Left 4. Breast, simple mastectomy, Right Specimen Clinical Information 1. Bilateral breast cancer (tl) Gross 1. Rapid Intraoperative Consult performed (Yes or No): No. Specimen: Left axillary sentinel node. Number and size: One node, 1.6  cm. Cut Surface(s): Tan pink, soft to rubbery, non blue. Block Summary: The node is sectioned and entirely submitted in one block labeled SLN for routine histology. 2. Received fresh is a 6 x 5 x 2 cm aggregate of fat within which are found eight possible lymph node ranging from 0.2 to 2.3 cm, and are entirely submitted as follows: A= three nodes, whole. B= four nodes, whole. C= largest node, bisected. Total 3 blocks. 3. Specimen: Left simple mastectomy, suture at medial. Specimen integrity (intact/disrupted): Intact. Weight: 397 grams. Size: 15 x 15 x 4.3 cm. Skin: There is an 11 x 4 cm ellipse of tan brown skin, with a 3 cm in diameter unremarkable nipple and surrounding areola. Tumor/cavity: Within the mid to inferior lateral specimen is a dumb-bell shaped clip, which is surrounded by tan yellow to red brown indurated tissue, 1.2 x 1 x 0.8 cm. A discrete lesion or mass is not identified. This area of indurated tissue with clip is 1.3 cm from the deep margin. Located 1 cm lateral to this area of tissue is a 2.8 x 2.3 x 2 cm area of tan yellow to red brown tissue with scattered induration, but no additional 3 of 4 Amended copy Corrected FINAL for Carpenter, Haneen  RADIOGRAPHIC STUDIES:  Nm Sentinel Node Inj-no Rpt (breast)  02/02/2012  CLINICAL DATA: bilateral breast cancer   Sulfur colloid was injected intradermally by the nuclear medicine  technologist for breast cancer sentinel node localization.  ASSESSMENT: 57 year old female with  #1 bilateral breast cancers with the right breast being DCIS and left breast invasive ductal carcinoma that is ER positive HER-2/neu negative. Patient is now status post bilateral mastectomies. She does not desire reconstruction.  #2 patient and I discussed adjuvant treatment including antiestrogen therapy. She currently does not wish to pursue this. However she would like to continue to be followed by me.  #3 patient with prior history of  right breast cancer that was ER positive and she was treated with a lumpectomy and radiation therapy followed by antiestrogen therapy.   PLAN:   #1 patient will followup with me in about 3 weeks time or sooner if need arises.  #2 we will discuss antiestrogen therapy at that time again.   All questions were answered. The patient knows to call the clinic with any problems, questions or concerns. We can certainly see the patient much sooner if necessary.  I spent 25 minutes counseling the patient face to face. The total time spent in the appointment was 30 minutes.    Drue Second, MD Medical/Oncology [facility] [phone] (beeper) [phone] (Office)

## 2012-03-08 ENCOUNTER — Ambulatory Visit (INDEPENDENT_AMBULATORY_CARE_PROVIDER_SITE_OTHER): Payer: 59 | Admitting: General Surgery

## 2012-03-08 ENCOUNTER — Encounter (INDEPENDENT_AMBULATORY_CARE_PROVIDER_SITE_OTHER): Payer: Self-pay | Admitting: General Surgery

## 2012-03-08 VITALS — BP 120/64 | HR 84 | Temp 97.2°F | Resp 12 | Ht 59.0 in | Wt 123.2 lb

## 2012-03-08 DIAGNOSIS — Z9889 Other specified postprocedural states: Secondary | ICD-10-CM

## 2012-03-08 NOTE — Patient Instructions (Signed)
Continue exercises

## 2012-03-08 NOTE — Progress Notes (Signed)
Procedure:  Bilateral mastectomies and axillary lymph nodes biopsies.  Date:  02/02/12  Pathology:  Right breast is T1cN0, Left breast is Tis.  History:  She is here for her second postoperative visit and is doing well.  She is still having some muscular discomfort.  Her arm range of motion is improving.  Exam: General- Is in NAD. Chest-bilateral chest wall incisions are clean and intact.  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  Plan:  Activities as tolerated.  RTC in 3 months.

## 2012-03-10 ENCOUNTER — Telehealth: Payer: Self-pay | Admitting: Medical Oncology

## 2012-03-10 ENCOUNTER — Telehealth: Payer: Self-pay | Admitting: Genetic Counselor

## 2012-03-10 DIAGNOSIS — C50919 Malignant neoplasm of unspecified site of unspecified female breast: Secondary | ICD-10-CM

## 2012-03-10 MED ORDER — OXYCODONE-ACETAMINOPHEN 5-325 MG PO TABS: 1.0000 | ORAL_TABLET | ORAL | Status: DC | PRN

## 2012-03-10 NOTE — Telephone Encounter (Signed)
Revealed negative test for clinically actionable mutations.  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  She will think about family studies.

## 2012-03-10 NOTE — Telephone Encounter (Signed)
Ok to refill 

## 2012-03-10 NOTE — Telephone Encounter (Signed)
Patient called Kerri Carpenter requesting refill of oxycodone-acetaminophen 5-325 mg. Pt states "I ran out of my pain medication and I took Aleve this morning, is it ok for me to take my B/P pill Lisinoprill with the Aleve?" Will review with MD. Scheduled appts 03/24/12 lab/MD.

## 2012-03-10 NOTE — Telephone Encounter (Signed)
Per MD, informed patient prescription ok to refill. Patient states will be in to pick-up prescription today. Expressed thanks. No further questions at this time.

## 2012-03-23 ENCOUNTER — Other Ambulatory Visit: Payer: Self-pay | Admitting: Emergency Medicine

## 2012-03-23 ENCOUNTER — Encounter: Payer: Self-pay | Admitting: Genetic Counselor

## 2012-03-23 DIAGNOSIS — C50519 Malignant neoplasm of lower-outer quadrant of unspecified female breast: Secondary | ICD-10-CM

## 2012-03-24 ENCOUNTER — Other Ambulatory Visit (HOSPITAL_BASED_OUTPATIENT_CLINIC_OR_DEPARTMENT_OTHER): Payer: 59 | Admitting: Lab

## 2012-03-24 ENCOUNTER — Ambulatory Visit (HOSPITAL_BASED_OUTPATIENT_CLINIC_OR_DEPARTMENT_OTHER): Payer: 59 | Admitting: Oncology

## 2012-03-24 ENCOUNTER — Encounter: Payer: Self-pay | Admitting: Oncology

## 2012-03-24 ENCOUNTER — Other Ambulatory Visit: Payer: Self-pay | Admitting: Emergency Medicine

## 2012-03-24 VITALS — BP 151/91 | HR 75 | Temp 98.3°F | Resp 20 | Ht 59.0 in | Wt 125.0 lb

## 2012-03-24 DIAGNOSIS — D0590 Unspecified type of carcinoma in situ of unspecified breast: Secondary | ICD-10-CM

## 2012-03-24 DIAGNOSIS — C50519 Malignant neoplasm of lower-outer quadrant of unspecified female breast: Secondary | ICD-10-CM

## 2012-03-24 DIAGNOSIS — Z170 Estrogen receptor positive status [ER+]: Secondary | ICD-10-CM

## 2012-03-24 DIAGNOSIS — C50919 Malignant neoplasm of unspecified site of unspecified female breast: Secondary | ICD-10-CM

## 2012-03-24 LAB — CBC WITH DIFFERENTIAL/PLATELET
BASO%: 0.8 % (ref 0.0–2.0)
Basophils Absolute: 0 10*3/uL (ref 0.0–0.1)
EOS%: 3.7 % (ref 0.0–7.0)
HCT: 34.5 % — ABNORMAL LOW (ref 34.8–46.6)
HGB: 11.7 g/dL (ref 11.6–15.9)
MCH: 28.7 pg (ref 25.1–34.0)
MCHC: 33.8 g/dL (ref 31.5–36.0)
MCV: 85 fL (ref 79.5–101.0)
MONO%: 11.9 % (ref 0.0–14.0)
NEUT%: 47.6 % (ref 38.4–76.8)
lymph#: 2 10*3/uL (ref 0.9–3.3)

## 2012-03-24 LAB — COMPREHENSIVE METABOLIC PANEL (CC13)
ALT: 17 U/L (ref 0–55)
AST: 13 U/L (ref 5–34)
Alkaline Phosphatase: 86 U/L (ref 40–150)
BUN: 9.3 mg/dL (ref 7.0–26.0)
Creatinine: 0.8 mg/dL (ref 0.6–1.1)
Total Bilirubin: 0.2 mg/dL (ref 0.20–1.20)

## 2012-03-24 MED ORDER — TAMOXIFEN CITRATE 20 MG PO TABS: 20.0000 mg | ORAL_TABLET | Freq: Every day | ORAL | Status: DC

## 2012-03-24 MED ORDER — OXYCODONE-ACETAMINOPHEN 5-325 MG PO TABS: 1.0000 | ORAL_TABLET | ORAL | Status: DC | PRN

## 2012-03-24 NOTE — Progress Notes (Signed)
Put disability form on nurse's desk. °

## 2012-03-24 NOTE — Patient Instructions (Addendum)
Proceed with tamoxifen 20 mg daily  Hydrocodone prescription given

## 2012-03-30 ENCOUNTER — Ambulatory Visit: Payer: 59 | Attending: Oncology | Admitting: Physical Therapy

## 2012-03-30 DIAGNOSIS — R293 Abnormal posture: Secondary | ICD-10-CM | POA: Insufficient documentation

## 2012-03-30 DIAGNOSIS — IMO0001 Reserved for inherently not codable concepts without codable children: Secondary | ICD-10-CM | POA: Insufficient documentation

## 2012-03-30 DIAGNOSIS — C50919 Malignant neoplasm of unspecified site of unspecified female breast: Secondary | ICD-10-CM | POA: Insufficient documentation

## 2012-04-03 NOTE — Progress Notes (Signed)
OFFICE PROGRESS NOTE  CC  Kerri Pitter, MD [address] [place] [address] Dr. Avel Peace  DIAGNOSIS: 57 year old female with diagnosis of bilateral breast cancers.  PRIOR THERAPY:  #1patient was originally seen in the multidisciplinary breast clinic at which time she was found to have a right-sided breast cancer T1 C. N0. 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 Because of this she went on to have bilateral mastectomies with axillary lymph node biopsies.  #2 her final pathology reveals right invasive ductal carcinoma, grade one measuring 1.4 cm ER positive nodes were negative.ER positive PR positive HER-2/neu negative with Ki-67 of 5%.  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  #4 patient is status post bilateral mastectomies on 02/01/2013 with the final pathology revealing on the right side 1.4 cm invasive ductal carcinoma grade 1 ER positive PR positive HER-2/neu negative Ki-67 5%. Sentinel node was negative for metastatic disease. She is also status post simple mastectomy on the left breast at the final pathology revealing a DCIS.  #5 patient will now begin antiestrogen therapy with tamoxifen 20 mg daily starting 03/24/2012. Risks and benefits of tamoxifen were discussed with the patient. A total of 5-10 years of therapy is planned.  #6 patient is also refer her to genetic counseling and testing  CURRENT THERAPY:tamoxifen 20 mg daily  INTERVAL HISTORY: Kerri Carpenter 57 y.o. female returns for followup visit. Overall patient is doing well she has recovered from her surgery quite nicely. She has no evidence of lymphedema. She also denies any nausea vomiting fevers chills night sweats. She does have some aches and pains specifically in the mastectomy sites. She is asking for hydrocodone and I have given her a prescription for this.  Remainder of the 10 point review of systems is negative. MEDICAL HISTORY: Past Medical History  Diagnosis Date  . Hypertension   . Breast cancer     Previous Right breast cancer 2000  . Anxiety     anxiety attacks recent  . GERD (gastroesophageal reflux disease)     hx    ALLERGIES:  is allergic to aspirin and penicillins.  MEDICATIONS:  Current Outpatient Prescriptions  Medication Sig Dispense Refill  . lisinopril-hydrochlorothiazide (PRINZIDE,ZESTORETIC) 10-12.5 MG per tablet Take 1 tablet by mouth daily.      Marland Kitchen oxyCODONE-acetaminophen (ROXICET) 5-325 MG per tablet Take 1-2 tablets by mouth every 4 (four) hours as needed for pain.  60 tablet  0  . LORazepam (ATIVAN) 0.5 MG tablet Take 1 tablet (0.5 mg total) by mouth every 6 (six) hours as needed for anxiety.  60 tablet  3  . tamoxifen (NOLVADEX) 20 MG tablet Take 1 tablet (20 mg total) by mouth daily.  90 tablet  12   No current facility-administered medications for this visit.    SURGICAL HISTORY:  Past Surgical History  Procedure Laterality Date  . Abdominal hysterectomy    . Breast lumpectomy  2000  . Axillary lymph node dissection  02/02/2012    Procedure: AXILLARY LYMPH NODE DISSECTION;  Surgeon: Adolph Pollack, MD;  Location: [facility] OR;  Service: General;  Laterality: Right;  Bilateral mastectomy; left axillary sentinel node biopsy; right axillary lymph node dissection.   . Total mastectomy  02/02/2012    Procedure: TOTAL MASTECTOMY;  Surgeon: Adolph Pollack, MD;  Location: [facility] OR;  Service: General;  Laterality: Bilateral;  Bilateral mastectomy; left axillary sentinel node biopsy; right axillary lymph node dissection.   Marland Kitchen Axillary sentinel node biopsy  02/02/2012    Procedure: AXILLARY SENTINEL NODE BIOPSY;  Surgeon: Adolph Pollack, MD;  Location: [facility] OR;  Service: General;  Laterality: Left;  Bilateral mastectomy; left axillary sentinel node biopsy; right axillary lymph node dissection.     REVIEW OF SYSTEMS:   Pertinent items are noted in HPI.   HEALTH MAINTENANCE:  PHYSICAL EXAMINATION: Blood pressure 151/91, pulse 75, temperature 98.3 F (36.8 C), temperature source Oral, resp. rate 20, height 4\' 11"  (1.499 m), weight 125 lb (56.7 kg). Body mass index is 25.23 kg/(m^2). ECOG PERFORMANCE STATUS: 1 - Symptomatic but completely ambulatory   General appearance: alert, cooperative and appears stated age Resp: clear to auscultation bilaterally Cardio: regular rate and rhythm, S1, S2 normal, no murmur, click, rub or gallop GI: soft, non-tender; bowel sounds normal; no masses,  no organomegaly Extremities: extremities normal, atraumatic, no cyanosis or edema Neurologic: Grossly normal   LABORATORY DATA: Lab Results  Component Value Date   WBC 5.6 03/24/2012   HGB 11.7 03/24/2012   HCT 34.5* 03/24/2012   MCV 85.0 03/24/2012   PLT 311 03/24/2012      Chemistry      Component Value Date/Time   NA 142 03/24/2012 1002   NA 140 01/29/2012 0951   K 3.6 03/24/2012 1002   K 3.6 01/29/2012 0951   CL 105 03/24/2012 1002   CL 102 01/29/2012 0951   CO2 27 03/24/2012 1002   CO2 27 01/29/2012 0951   BUN 9.3 03/24/2012 1002   BUN 9 01/29/2012 0951   CREATININE 0.8 03/24/2012 1002   CREATININE 0.74 01/29/2012 0951      Component Value Date/Time   CALCIUM 9.8 03/24/2012 1002   CALCIUM 9.9 01/29/2012 0951   ALKPHOS 86 03/24/2012 1002   ALKPHOS 76 01/29/2012 0951   AST 13 03/24/2012 1002   AST 21 01/29/2012 0951   ALT 17 03/24/2012 1002   ALT 21 01/29/2012 0951   BILITOT 0.20 03/24/2012 1002   BILITOT 0.3 01/29/2012 0951    02/02/12 REASON FOR ADDENDUM, AMENDMENT OR CORRECTION: [id number]: part 3 oncology table: TNM code should be pTis, pN0. ADDITIONAL INFORMATION: 4. CHROMOGENIC IN-SITU HYBRIDIZATION Interpretation HER-2/NEU BY CISH - NO AMPLIFICATION OF HER-2 DETECTED. THE RATIO OF HER-2: CEP 17 SIGNALS WAS 1.30. Reference range: Ratio: HER2:CEP17 < 1.8 - gene amplification not observed Ratio: HER2:CEP  17 1.8-2.2 - equivocal result Ratio: HER2:CEP17 > 2.2 - gene amplification observed Pecola Leisure MD Pathologist, Electronic Signature ( Signed 02/09/2012) FINAL DIAGNOSIS Diagnosis 1. Lymph node, sentinel, biopsy, Left axillary - ONE LYMPH NODE, NEGATIVE FOR TUMOR (0/1). 2. Lymph nodes, regional resection, Right axillary - ONE LYMPH NODE, NEGATIVE FOR TUMOR (0/1). 3. Breast, simple mastectomy, Left - HIGH GRADE DUCTAL CARCINOMA IN SITU, SEE COMMENT. - IN SITU CARCINOMA IS 1.3 CM FROM NEAREST MARGIN (DEEP). 1 of 4 Amended copy Corrected FINAL for RUFINA, KIMERY [phone]) Diagnosis(continued) 4. Breast, simple mastectomy, Right - INVASIVE DUCTAL CARCINOMA, GRADE I (1.4 CM). - INVASIVE TUMOR IS FOCALLY PRESENT AT ANTERIOR MARGIN - NO LYMPHOVASCULAR INVASION IDENTIFIED. - SEE TUMOR SYNOPTIC TEMPLATE BELOW. Microscopic Comment 3. BREAST, IN SITU CARCINOMA Specimen, including laterality: Left breast Procedure: Simple mastectomy Grade of carcinoma: III Necrosis: Present Estimated tumor size: (gross measurement or glass slide measurement): See comment Treatment effect: None If present, treatment effect in breast tissue, lymph nodes or both: N/A  Distance to closest margin: 1.3 cm If margin positive, focally or broadly: N/A Breast prognostic profile: Not repeated Estrogen receptor: Previous study demonstrated 51% positivity ([id number]) Progesterone receptor: Previous study demonstrated 10% positivity ([id number]) Lymph nodes: Examined: One (part 1) Lymph nodes with metastasis: 0 TNM: pTis, pN0 Comments: Multiple foci of ductal carcinoma in situ are identified involving the 1.2 cm indurated area associated with the dumb bell shaped clip as well as the additional 2.8 cm area of induration not associated with clips (see gross description). Additional non-neoplastic findings include fibrocystic change with usual ductal hyperplasia, sclerosing adenosis, fibroadenomatoid  nodules, and microcalcifications in benign ducts and lobules. 4. BREAST, INVASIVE TUMOR, WITH LYMPH NODE SAMPLING Specimen, including laterality: Right breast Procedure: Simple mastectomy Grade: I of III Tubule formation: 1 Nuclear pleomorphism: 1 Mitotic: 1 Tumor size (gross measurement): 1.4 cm Margins: Invasive, distance to closest margin: Present, anterior margin . In-situ, distance to closest margin: n/a If margin positive, focally or broadly: Focally Lymphovascular invasion: Absent Ductal carcinoma in situ: Absent Grade: N/A Extensive intraductal component: N/A Lobular neoplasia: Present, atypical hyperplasia Tumor focality: Unifocal Treatment effect: None If present, treatment effect in breast tissue, lymph nodes or both: N/A Extent of tumor: Skin: Negative for tumor Nipple: Negative for tumor 2 of 4 Amended copy Corrected FINAL for AICIA, BABINSKI F [phone]) Microscopic Comment(continued) Skeletal muscle: N/A Lymph nodes: # examined: 1 (part 2) Lymph nodes with metastasis: 0 Breast prognostic profile: Estrogen receptor: Not repeated, previous study demonstrated 99% positivity ([id number]) Progesterone receptor: Not repeated, previous study demonstrated 51% positivity ([id number]) Her 2 neu: Repeated, previous study demonstrate no amplification ([id number]) Ki-67: Not repeated, previous study demonstrated 5% proliferation rate ([id number]) Non-neoplastic breast: Previous biopsy site, fibrocystic change with usual ductal hyperplasia and microcalcifications in benign ducts and lobules TNM: pT1c, pN0, pMX Comments: Invasive tumor focally involves the cauterized tissue edge in slide 4A. Following review of the mastectomy with the Pathologist's Assistant who grossed the case, slide section 4A was determined to be from the anterior margin. In addition to the tumor being present at the anterior margin, invasive tumor is less than 0.1 mm from the nearest  anterior margin in a separate slide section (slide F). (CR:kh 02-04-12) Pecola Leisure MD Pathologist, Electronic Signature (Case signed 02/08/2012) Specimen Gross and Clinical Information Specimen(s) Obtained: 1. Lymph node, sentinel, biopsy, Left axillary 2. Lymph nodes, regional resection, Right axillary 3. Breast, simple mastectomy, Left 4. Breast, simple mastectomy, Right Specimen Clinical Information 1. Bilateral breast cancer (tl) Gross 1. Rapid Intraoperative Consult performed (Yes or No): No. Specimen: Left axillary sentinel node. Number and size: One node, 1.6 cm. Cut Surface(s): Tan pink, soft to rubbery, non blue. Block Summary: The node is sectioned and entirely submitted in one block labeled SLN for routine histology. 2. Received fresh is a 6 x 5 x 2 cm aggregate of fat within which are found eight possible lymph node ranging from 0.2 to 2.3 cm, and are entirely submitted as follows: A= three nodes, whole. B= four nodes, whole. C= largest node, bisected. Total 3 blocks. 3. Specimen: Left simple mastectomy, suture at medial. Specimen integrity (intact/disrupted): Intact. Weight: 397 grams. Size: 15 x 15 x 4.3 cm. Skin: There is an 11 x 4 cm ellipse of tan brown skin, with a 3 cm in diameter unremarkable nipple and surrounding areola. Tumor/cavity: Within the mid to inferior lateral specimen is a dumb-bell shaped clip, which is surrounded by tan yellow to red brown indurated tissue, 1.2 x 1 x 0.8 cm. A  discrete lesion or mass is not identified. This area of indurated tissue with clip is 1.3 cm from the deep margin. Located 1 cm lateral to this area of tissue is a 2.8 x 2.3 x 2 cm area of tan yellow to red brown tissue with scattered induration, but no additional 3 of 4 Amended copy Corrected FINAL for Carpenter, Hanni  RADIOGRAPHIC STUDIES:  Nm Sentinel Node Inj-no Rpt (breast)  02/02/2012  CLINICAL DATA: bilateral breast cancer   Sulfur colloid was injected  intradermally by the nuclear medicine  technologist for breast cancer sentinel node localization.      ASSESSMENT: 57 year old female with  #1 bilateral breast cancers with the right breast being DCIS and left breast invasive ductal carcinoma that is ER positive HER-2/neu negative. Patient is now status post bilateral mastectomies. She does not desire reconstruction.  #2 patient and I discussed adjuvant treatment including antiestrogen therapy. She currently does not wish to pursue this. However she would like to continue to be followed by me.  #3 patient with prior history of right breast cancer that was ER positive and she was treated with a lumpectomy and radiation therapy followed by antiestrogen therapy.  #4 patient and I again discussed antiestrogen therapy adjuvantly with tamoxifen. We discussed the rationale as well as the side effects. She was very concerned that her on her on an aromatase inhibitor but she has not been able to tolerate in the past. Patient has no history of strokes or any CVAs. She has no prior history of abnormal Pap smears. Therefore I do think that she would benefit from tamoxifen and I have discussed this with her. Again rationale has been discussed. She is now agreeable to it. She was given a prescription of tamoxifen 20 mg taken on a daily basis.   PLAN:  #1 patient will begin tamoxifen 20 mg daily  #2 I will see her back in 3 month's time in followup.  #3 she was also given a prescription for hydrocodone.  All questions were answered. The patient knows to call the clinic with any problems, questions or concerns. We can certainly see the patient much sooner if necessary.  I spent 25 minutes counseling the patient face to face. The total time spent in the appointment was 30 minutes.    Drue Second, MD Medical/Oncology [facility] [phone] (beeper) [phone] (Office)

## 2012-04-05 ENCOUNTER — Encounter: Payer: Self-pay | Admitting: *Deleted

## 2012-04-05 NOTE — Progress Notes (Signed)
Clinical Social Worker met with pt to discuss financial resources.  CSW and pt reviewed applications for [employer] [place], and [employer]  Once pt is able to gather information and fill out applications she will contact CSW to complete and submit.  CSW encouraged pt call with any additional questions or concerns.    Tamala Julian, MSW, LCSW Clinical Social Worker [facility] [phone]

## 2012-04-06 ENCOUNTER — Encounter: Payer: 59 | Admitting: Physical Therapy

## 2012-04-06 ENCOUNTER — Ambulatory Visit: Payer: 59

## 2012-04-11 ENCOUNTER — Telehealth (INDEPENDENT_AMBULATORY_CARE_PROVIDER_SITE_OTHER): Payer: Self-pay

## 2012-04-11 ENCOUNTER — Encounter (INDEPENDENT_AMBULATORY_CARE_PROVIDER_SITE_OTHER): Payer: Self-pay

## 2012-04-11 NOTE — Telephone Encounter (Signed)
Pt's RTW note is at the front desk to be picked up.  Unable to get answer at the residence.

## 2012-04-12 ENCOUNTER — Ambulatory Visit: Payer: 59 | Admitting: Physical Therapy

## 2012-04-14 ENCOUNTER — Telehealth: Payer: Self-pay | Admitting: Medical Oncology

## 2012-04-14 ENCOUNTER — Encounter: Payer: Self-pay | Admitting: Oncology

## 2012-04-14 NOTE — Progress Notes (Signed)
Put disability form on nurse's desk. °

## 2012-04-14 NOTE — Telephone Encounter (Signed)
Returned pts call regarding [employer], patient asking to drop off form given her authorization to return to work. Informed pt to drop form off at her convenience and we will have that taken take of for her. Patient to call wit any questions or concerns.

## 2012-04-15 ENCOUNTER — Ambulatory Visit: Payer: 59 | Admitting: Physical Therapy

## 2012-04-18 ENCOUNTER — Ambulatory Visit: Payer: 59 | Admitting: *Deleted

## 2012-04-22 ENCOUNTER — Ambulatory Visit: Payer: 59 | Admitting: *Deleted

## 2012-04-26 ENCOUNTER — Ambulatory Visit: Payer: 59

## 2012-04-29 ENCOUNTER — Encounter: Payer: 59 | Admitting: Physical Therapy

## 2012-05-03 ENCOUNTER — Ambulatory Visit: Payer: 59 | Attending: Oncology

## 2012-05-03 DIAGNOSIS — C50919 Malignant neoplasm of unspecified site of unspecified female breast: Secondary | ICD-10-CM | POA: Insufficient documentation

## 2012-05-03 DIAGNOSIS — R293 Abnormal posture: Secondary | ICD-10-CM | POA: Insufficient documentation

## 2012-05-03 DIAGNOSIS — IMO0001 Reserved for inherently not codable concepts without codable children: Secondary | ICD-10-CM | POA: Insufficient documentation

## 2012-05-11 ENCOUNTER — Ambulatory Visit: Payer: 59 | Admitting: Physical Therapy

## 2012-05-12 ENCOUNTER — Encounter: Payer: 59 | Admitting: Physical Therapy

## 2012-05-18 ENCOUNTER — Ambulatory Visit: Payer: 59 | Admitting: Physical Therapy

## 2012-05-19 ENCOUNTER — Ambulatory Visit: Payer: 59

## 2012-06-01 ENCOUNTER — Other Ambulatory Visit: Payer: Self-pay | Admitting: Emergency Medicine

## 2012-06-02 ENCOUNTER — Telehealth: Payer: Self-pay | Admitting: Emergency Medicine

## 2012-06-02 NOTE — Telephone Encounter (Signed)
Patient states feeling well. Gave her appointment for follow up on 06/28/12 at 3:15 with Augustin Schooling with labs prior. Patient voiced understanding.

## 2012-06-28 ENCOUNTER — Encounter: Payer: Self-pay | Admitting: Adult Health

## 2012-06-28 ENCOUNTER — Telehealth: Payer: Self-pay | Admitting: *Deleted

## 2012-06-28 ENCOUNTER — Other Ambulatory Visit (HOSPITAL_BASED_OUTPATIENT_CLINIC_OR_DEPARTMENT_OTHER): Payer: 59 | Admitting: Lab

## 2012-06-28 ENCOUNTER — Ambulatory Visit (HOSPITAL_BASED_OUTPATIENT_CLINIC_OR_DEPARTMENT_OTHER): Payer: 59 | Admitting: Adult Health

## 2012-06-28 VITALS — BP 144/86 | HR 79 | Temp 97.9°F | Resp 20 | Ht 59.0 in | Wt 130.0 lb

## 2012-06-28 DIAGNOSIS — E559 Vitamin D deficiency, unspecified: Secondary | ICD-10-CM

## 2012-06-28 DIAGNOSIS — C50519 Malignant neoplasm of lower-outer quadrant of unspecified female breast: Secondary | ICD-10-CM

## 2012-06-28 DIAGNOSIS — C50511 Malignant neoplasm of lower-outer quadrant of right female breast: Secondary | ICD-10-CM

## 2012-06-28 DIAGNOSIS — C50919 Malignant neoplasm of unspecified site of unspecified female breast: Secondary | ICD-10-CM

## 2012-06-28 LAB — COMPREHENSIVE METABOLIC PANEL (CC13)
AST: 14 U/L (ref 5–34)
BUN: 11.4 mg/dL (ref 7.0–26.0)
Calcium: 8.8 mg/dL (ref 8.4–10.4)
Chloride: 106 mEq/L (ref 98–107)
Creatinine: 0.8 mg/dL (ref 0.6–1.1)
Total Bilirubin: <0.2 mg/dL (ref 0.20–1.20)

## 2012-06-28 LAB — CBC WITH DIFFERENTIAL/PLATELET
Basophils Absolute: 0 10*3/uL (ref 0.0–0.1)
EOS%: 3.3 % (ref 0.0–7.0)
HCT: 34.1 % — ABNORMAL LOW (ref 34.8–46.6)
HGB: 11.3 g/dL — ABNORMAL LOW (ref 11.6–15.9)
LYMPH%: 37.4 % (ref 14.0–49.7)
MCH: 28.1 pg (ref 25.1–34.0)
MCHC: 33.1 g/dL (ref 31.5–36.0)
MCV: 84.9 fL (ref 79.5–101.0)
MONO%: 9.4 % (ref 0.0–14.0)
NEUT%: 49.3 % (ref 38.4–76.8)
Platelets: 299 10*3/uL (ref 145–400)
lymph#: 2.6 10*3/uL (ref 0.9–3.3)

## 2012-06-28 NOTE — Progress Notes (Signed)
OFFICE PROGRESS NOTE  CC  Kerri Pitter, MD [address] [place] [address] Dr. Avel Peace  DIAGNOSIS: 57 year old female with diagnosis of bilateral breast cancers.  PRIOR THERAPY:  #1patient was originally seen in the multidisciplinary breast clinic at which time she was found to have a right-sided breast cancer T1 C. N0. 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 Because of this she went on to have bilateral mastectomies with axillary lymph node biopsies.  #2 her final pathology reveals right invasive ductal carcinoma, grade one measuring 1.4 cm ER positive nodes were negative.ER positive PR positive HER-2/neu negative with Ki-67 of 5%.  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  #4 patient is status post bilateral mastectomies on 02/01/2013 with the final pathology revealing on the right side 1.4 cm invasive ductal carcinoma grade 1 ER positive PR positive HER-2/neu negative Ki-67 5%. Sentinel node was negative for metastatic disease. She is also status post simple mastectomy on the left breast at the final pathology revealing a DCIS.  #5 patient will now begin antiestrogen therapy with tamoxifen 20 mg daily starting 03/24/2012. Risks and benefits of tamoxifen were discussed with the patient. A total of 5-10 years of therapy is planned.  #6 patient is also refer her to genetic counseling and testing  CURRENT THERAPY:tamoxifen 20 mg daily  INTERVAL HISTORY: Kerri Carpenter 57 y.o. female returns for followup visit. She is doing well.  She is taking the Tamoxifen daily.  She has noticed fatigue and weight gain since January.  She started Crestor on 06/04/12 as well.  She has had the fatigue for about a month as well.  She denies joint aches/pains, swelling, vaginal bleeding, or any further concerns.   Health maintenance updated below.    MEDICAL HISTORY: Past Medical History  Diagnosis Date  . Hypertension   . Breast cancer     Previous Right breast cancer 2000  . Anxiety     anxiety attacks recent  . GERD (gastroesophageal reflux disease)     hx    ALLERGIES:  is allergic to aspirin and penicillins.  MEDICATIONS:  Current Outpatient Prescriptions  Medication Sig Dispense Refill  . lisinopril-hydrochlorothiazide (PRINZIDE,ZESTORETIC) 10-12.5 MG per tablet Take 1 tablet by mouth daily.      Marland Kitchen LORazepam (ATIVAN) 0.5 MG tablet Take 1 tablet (0.5 mg total) by mouth every 6 (six) hours as needed for anxiety.  60 tablet  3  . oxyCODONE-acetaminophen (ROXICET) 5-325 MG per tablet Take 1-2 tablets by mouth every 4 (four) hours as needed for pain.  60 tablet  0  . tamoxifen (NOLVADEX) 20 MG tablet Take 1 tablet (20 mg total) by mouth daily.  90 tablet  12   No current facility-administered medications for this visit.    SURGICAL HISTORY:  Past Surgical History  Procedure Laterality Date  . Abdominal hysterectomy    . Breast lumpectomy  2000  . Axillary lymph node dissection  02/02/2012    Procedure: AXILLARY LYMPH NODE DISSECTION;  Surgeon: Adolph Pollack, MD;  Location: [facility] OR;  Service: General;  Laterality: Right;  Bilateral mastectomy; left axillary sentinel node biopsy; right axillary lymph node dissection.   . Total mastectomy  02/02/2012    Procedure: TOTAL MASTECTOMY;  Surgeon: Adolph Pollack, MD;  Location: [facility] OR;  Service: General;  Laterality:  Bilateral;  Bilateral mastectomy; left axillary sentinel node biopsy; right axillary lymph node dissection.   Marland Kitchen Axillary sentinel node biopsy  02/02/2012    Procedure: AXILLARY SENTINEL NODE BIOPSY;  Surgeon: Adolph Pollack, MD;  Location: [facility] OR;  Service: General;  Laterality: Left;  Bilateral mastectomy; left axillary sentinel node biopsy; right axillary lymph node dissection.     REVIEW OF SYSTEMS:  General: fatigue (-), night sweats (-), fever (-), pain  (-) Lymph: palpable nodes (-) HEENT: vision changes (-), mucositis (-), gum bleeding (-), epistaxis (-) Cardiovascular: chest pain (-), palpitations (-) Pulmonary: shortness of breath (-), dyspnea on exertion (-), cough (-), hemoptysis (-) GI:  Early satiety (-), melena (-), dysphagia (-), nausea/vomiting (-), diarrhea (-) GU: dysuria (-), hematuria (-), incontinence (-) Musculoskeletal: joint swelling (-), joint pain (-), back pain (-) Neuro: weakness (-), numbness (-), headache (-), confusion (-) Skin: Rash (-), lesions (-), dryness (-) Psych: depression (-), suicidal/homicidal ideation (-), feeling of hopelessness (-)   Health Maintenance  Mammogram: s/p mastectomy Colonoscopy: due this year, last in 2013 Bone Density Scan: never Pap Smear: 05/2012  Eye Exam: 2013 Vitamin D Level: never Lipid Panel: 05/2012   PHYSICAL EXAMINATION: Blood pressure 144/86, pulse 79, temperature 97.9 F (36.6 C), temperature source Oral, resp. rate 20, height 4\' 11"  (1.499 m), weight 130 lb (58.968 kg). Body mass index is 26.24 kg/(m^2). General: Patient is a well appearing female in no acute distress HEENT: PERRLA, sclerae anicteric no conjunctival pallor, MMM Neck: supple, no palpable adenopathy Lungs: clear to auscultation bilaterally, no wheezes, rhonchi, or rales Cardiovascular: regular rate rhythm, S1, S2, no murmurs, rubs or gallops Abdomen: Soft, non-tender, non-distended, normoactive bowel sounds, no HSM Extremities: warm and well perfused, no clubbing, cyanosis, or edema Skin: No rashes or lesions Neuro: Non-focal Breasts: ECOG PERFORMANCE STATUS: 1 - Symptomatic but completely ambulatory  LABORATORY DATA: Lab Results  Component Value Date   WBC 7.0 06/28/2012   HGB 11.3* 06/28/2012   HCT 34.1* 06/28/2012   MCV 84.9 06/28/2012   PLT 299 06/28/2012      Chemistry      Component Value Date/Time   NA 142 03/24/2012 1002   NA 140 01/29/2012 0951   K 3.6 03/24/2012 1002   K 3.6 01/29/2012  0951   CL 105 03/24/2012 1002   CL 102 01/29/2012 0951   CO2 27 03/24/2012 1002   CO2 27 01/29/2012 0951   BUN 9.3 03/24/2012 1002   BUN 9 01/29/2012 0951   CREATININE 0.8 03/24/2012 1002   CREATININE 0.74 01/29/2012 0951      Component Value Date/Time   CALCIUM 9.8 03/24/2012 1002   CALCIUM 9.9 01/29/2012 0951   ALKPHOS 86 03/24/2012 1002   ALKPHOS 76 01/29/2012 0951   AST 13 03/24/2012 1002   AST 21 01/29/2012 0951   ALT 17 03/24/2012 1002   ALT 21 01/29/2012 0951   BILITOT 0.20 03/24/2012 1002   BILITOT 0.3 01/29/2012 0951    02/02/12 REASON FOR ADDENDUM, AMENDMENT OR CORRECTION: [id number]: part 3 oncology table: TNM code should be pTis, pN0. ADDITIONAL INFORMATION: 4. CHROMOGENIC IN-SITU HYBRIDIZATION Interpretation HER-2/NEU BY CISH - NO AMPLIFICATION OF HER-2 DETECTED. THE RATIO OF HER-2: CEP 17 SIGNALS WAS 1.30. Reference range: Ratio: HER2:CEP17 < 1.8 - gene amplification not observed Ratio: HER2:CEP 17 1.8-2.2 - equivocal result Ratio: HER2:CEP17 > 2.2 - gene amplification observed Pecola Leisure MD Pathologist, Electronic Signature ( Signed 02/09/2012) FINAL DIAGNOSIS Diagnosis 1. Lymph node, sentinel, biopsy, Left axillary -  ONE LYMPH NODE, NEGATIVE FOR TUMOR (0/1). 2. Lymph nodes, regional resection, Right axillary - ONE LYMPH NODE, NEGATIVE FOR TUMOR (0/1). 3. Breast, simple mastectomy, Left - HIGH GRADE DUCTAL CARCINOMA IN SITU, SEE COMMENT. - IN SITU CARCINOMA IS 1.3 CM FROM NEAREST MARGIN (DEEP). 1 of 4 Amended copy Corrected FINAL for CIELLE, AGUILA [phone]) Diagnosis(continued) 4. Breast, simple mastectomy, Right - INVASIVE DUCTAL CARCINOMA, GRADE I (1.4 CM). - INVASIVE TUMOR IS FOCALLY PRESENT AT ANTERIOR MARGIN - NO LYMPHOVASCULAR INVASION IDENTIFIED. - SEE TUMOR SYNOPTIC TEMPLATE BELOW. Microscopic Comment 3. BREAST, IN SITU CARCINOMA Specimen, including laterality: Left breast Procedure: Simple mastectomy Grade of carcinoma:  III Necrosis: Present Estimated tumor size: (gross measurement or glass slide measurement): See comment Treatment effect: None If present, treatment effect in breast tissue, lymph nodes or both: N/A Distance to closest margin: 1.3 cm If margin positive, focally or broadly: N/A Breast prognostic profile: Not repeated Estrogen receptor: Previous study demonstrated 51% positivity ([id number]) Progesterone receptor: Previous study demonstrated 10% positivity ([id number]) Lymph nodes: Examined: One (part 1) Lymph nodes with metastasis: 0 TNM: pTis, pN0 Comments: Multiple foci of ductal carcinoma in situ are identified involving the 1.2 cm indurated area associated with the dumb bell shaped clip as well as the additional 2.8 cm area of induration not associated with clips (see gross description). Additional non-neoplastic findings include fibrocystic change with usual ductal hyperplasia, sclerosing adenosis, fibroadenomatoid nodules, and microcalcifications in benign ducts and lobules. 4. BREAST, INVASIVE TUMOR, WITH LYMPH NODE SAMPLING Specimen, including laterality: Right breast Procedure: Simple mastectomy Grade: I of III Tubule formation: 1 Nuclear pleomorphism: 1 Mitotic: 1 Tumor size (gross measurement): 1.4 cm Margins: Invasive, distance to closest margin: Present, anterior margin . In-situ, distance to closest margin: n/a If margin positive, focally or broadly: Focally Lymphovascular invasion: Absent Ductal carcinoma in situ: Absent Grade: N/A Extensive intraductal component: N/A Lobular neoplasia: Present, atypical hyperplasia Tumor focality: Unifocal Treatment effect: None If present, treatment effect in breast tissue, lymph nodes or both: N/A Extent of tumor: Skin: Negative for tumor Nipple: Negative for tumor 2 of 4 Amended copy Corrected FINAL for BRITTNEI, JAGIELLO F [phone]) Microscopic Comment(continued) Skeletal muscle: N/A Lymph nodes: # examined: 1  (part 2) Lymph nodes with metastasis: 0 Breast prognostic profile: Estrogen receptor: Not repeated, previous study demonstrated 99% positivity ([id number]) Progesterone receptor: Not repeated, previous study demonstrated 51% positivity ([id number]) Her 2 neu: Repeated, previous study demonstrate no amplification ([id number]) Ki-67: Not repeated, previous study demonstrated 5% proliferation rate ([id number]) Non-neoplastic breast: Previous biopsy site, fibrocystic change with usual ductal hyperplasia and microcalcifications in benign ducts and lobules TNM: pT1c, pN0, pMX Comments: Invasive tumor focally involves the cauterized tissue edge in slide 4A. Following review of the mastectomy with the Pathologist's Assistant who grossed the case, slide section 4A was determined to be from the anterior margin. In addition to the tumor being present at the anterior margin, invasive tumor is less than 0.1 mm from the nearest anterior margin in a separate slide section (slide F). (CR:kh 02-04-12) Pecola Leisure MD Pathologist, Electronic Signature (Case signed 02/08/2012) Specimen Gross and Clinical Information Specimen(s) Obtained: 1. Lymph node, sentinel, biopsy, Left axillary 2. Lymph nodes, regional resection, Right axillary 3. Breast, simple mastectomy, Left 4. Breast, simple mastectomy, Right Specimen Clinical Information 1. Bilateral breast cancer (tl) Gross 1. Rapid Intraoperative Consult performed (Yes or No): No. Specimen: Left axillary sentinel node. Number and size: One node, 1.6 cm. Cut Surface(s): Tan pink, soft to rubbery, non blue. Block Summary: The  node is sectioned and entirely submitted in one block labeled SLN for routine histology. 2. Received fresh is a 6 x 5 x 2 cm aggregate of fat within which are found eight possible lymph node ranging from 0.2 to 2.3 cm, and are entirely submitted as follows: A= three nodes, whole. B= four nodes, whole. C= largest node, bisected.  Total 3 blocks. 3. Specimen: Left simple mastectomy, suture at medial. Specimen integrity (intact/disrupted): Intact. Weight: 397 grams. Size: 15 x 15 x 4.3 cm. Skin: There is an 11 x 4 cm ellipse of tan brown skin, with a 3 cm in diameter unremarkable nipple and surrounding areola. Tumor/cavity: Within the mid to inferior lateral specimen is a dumb-bell shaped clip, which is surrounded by tan yellow to red brown indurated tissue, 1.2 x 1 x 0.8 cm. A discrete lesion or mass is not identified. This area of indurated tissue with clip is 1.3 cm from the deep margin. Located 1 cm lateral to this area of tissue is a 2.8 x 2.3 x 2 cm area of tan yellow to red brown tissue with scattered induration, but no additional 3 of 4 Amended copy Corrected FINAL for Carpenter, Zabella  RADIOGRAPHIC STUDIES:  Nm Sentinel Node Inj-no Rpt (breast)  02/02/2012  CLINICAL DATA: bilateral breast cancer   Sulfur colloid was injected intradermally by the nuclear medicine  technologist for breast cancer sentinel node localization.      ASSESSMENT: 57 year old female with  #1 bilateral breast cancers with the right breast being DCIS and left breast invasive ductal carcinoma that is ER positive HER-2/neu negative. Patient is now status post bilateral mastectomies. She does not desire reconstruction.  #2 patient and I discussed adjuvant treatment including antiestrogen therapy. She currently does not wish to pursue this. However she would like to continue to be followed by me.  #3 patient with prior history of right breast cancer that was ER positive and she was treated with a lumpectomy and radiation therapy followed by antiestrogen therapy.  #4 patient and I again discussed antiestrogen therapy adjuvantly with tamoxifen. We discussed the rationale as well as the side effects. She was very concerned that her on her on an aromatase inhibitor but she has not been able to tolerate in the past. Patient has no history  of strokes or any CVAs. She has no prior history of abnormal Pap smears. Therefore I do think that she would benefit from tamoxifen and I have discussed this with her. Again rationale has been discussed. She is now agreeable to it. She was given a prescription of tamoxifen 20 mg taken on a daily basis.   PLAN:   #1 Doing well.  No sign of recurrence.  Continue Tamoxifen.  Pt will get appt with opthalmologist and GI in the next few months.    #2 I will see her back in 6 month's time in followup.  All questions were answered. The patient knows to call the clinic with any problems, questions or concerns. We can certainly see the patient much sooner if necessary.  I spent 25 minutes counseling the patient face to face. The total time spent in the appointment was 30 minutes.  Cherie Ouch Lyn Hollingshead, NP Medical Oncology [facility] Phone: [phone]

## 2012-06-28 NOTE — Telephone Encounter (Signed)
appts made and printed...td 

## 2012-06-28 NOTE — Patient Instructions (Addendum)
Doing well.  No sign of recurrence. Continue tamoxifen. Please schedule an eye exam.  Please call us if you have any questions or concerns.

## 2012-07-12 ENCOUNTER — Encounter (INDEPENDENT_AMBULATORY_CARE_PROVIDER_SITE_OTHER): Payer: Self-pay | Admitting: General Surgery

## 2012-07-12 ENCOUNTER — Ambulatory Visit (INDEPENDENT_AMBULATORY_CARE_PROVIDER_SITE_OTHER): Payer: 59 | Admitting: General Surgery

## 2012-07-12 VITALS — HR 88 | Temp 97.9°F | Resp 18 | Ht 59.0 in | Wt 130.4 lb

## 2012-07-12 DIAGNOSIS — Z853 Personal history of malignant neoplasm of breast: Secondary | ICD-10-CM

## 2012-07-12 NOTE — Patient Instructions (Signed)
Call if you feel and new lumps on your chest wall.

## 2012-07-12 NOTE — Progress Notes (Signed)
Procedure:  Bilateral mastectomies and axillary lymph nodes biopsies.  Date:  02/02/12  Pathology:  Right breast is T1cN0, Left breast is Tis.  History:  She is here for Long-term followup of her bilateral breast cancers. She is doing well. She had some mild lymphedema of the right arm which has been treated successfully with therapy and a lymphedema sleeve. She denies any chest wall nodules. No adenopathy.  Exam: General- Is in NAD. Chest-bilateral chest wall scars are present with no nodules palpable.  Lymph nodes-no palpable cervical, supraclavicular, or axillary adenopathy.  Extr-Right arm lymphedema sleeve is on.  Assessment:  Bilateral breast cancer with right being invasive and left being DCIS-No clinical evidence of recurrence; has some mild right arm lymphedema.  Plan:  RTC in 3 months.

## 2012-09-27 ENCOUNTER — Ambulatory Visit (INDEPENDENT_AMBULATORY_CARE_PROVIDER_SITE_OTHER): Payer: 59 | Admitting: General Surgery

## 2012-12-29 ENCOUNTER — Encounter: Payer: Self-pay | Admitting: Oncology

## 2012-12-29 ENCOUNTER — Encounter (INDEPENDENT_AMBULATORY_CARE_PROVIDER_SITE_OTHER): Payer: Self-pay

## 2012-12-29 ENCOUNTER — Ambulatory Visit (HOSPITAL_BASED_OUTPATIENT_CLINIC_OR_DEPARTMENT_OTHER): Payer: 59 | Admitting: Oncology

## 2012-12-29 ENCOUNTER — Telehealth: Payer: Self-pay | Admitting: Oncology

## 2012-12-29 ENCOUNTER — Other Ambulatory Visit (HOSPITAL_BASED_OUTPATIENT_CLINIC_OR_DEPARTMENT_OTHER): Payer: 59 | Admitting: Lab

## 2012-12-29 VITALS — BP 95/58 | HR 79 | Temp 98.2°F | Resp 20 | Ht 59.0 in | Wt 132.9 lb

## 2012-12-29 DIAGNOSIS — C50511 Malignant neoplasm of lower-outer quadrant of right female breast: Secondary | ICD-10-CM

## 2012-12-29 DIAGNOSIS — E559 Vitamin D deficiency, unspecified: Secondary | ICD-10-CM

## 2012-12-29 DIAGNOSIS — Z170 Estrogen receptor positive status [ER+]: Secondary | ICD-10-CM

## 2012-12-29 DIAGNOSIS — C50519 Malignant neoplasm of lower-outer quadrant of unspecified female breast: Secondary | ICD-10-CM

## 2012-12-29 DIAGNOSIS — D0590 Unspecified type of carcinoma in situ of unspecified breast: Secondary | ICD-10-CM

## 2012-12-29 LAB — COMPREHENSIVE METABOLIC PANEL (CC13)
Albumin: 3.4 g/dL — ABNORMAL LOW (ref 3.5–5.0)
Anion Gap: 12 mEq/L — ABNORMAL HIGH (ref 3–11)
BUN: 11.8 mg/dL (ref 7.0–26.0)
Calcium: 9.7 mg/dL (ref 8.4–10.4)
Chloride: 106 mEq/L (ref 98–109)
Glucose: 116 mg/dl (ref 70–140)
Potassium: 3.5 mEq/L (ref 3.5–5.1)

## 2012-12-29 LAB — CBC WITH DIFFERENTIAL/PLATELET
Basophils Absolute: 0.1 10*3/uL (ref 0.0–0.1)
HCT: 32.8 % — ABNORMAL LOW (ref 34.8–46.6)
HGB: 10.7 g/dL — ABNORMAL LOW (ref 11.6–15.9)
MCH: 27 pg (ref 25.1–34.0)
MONO#: 0.7 10*3/uL (ref 0.1–0.9)
NEUT%: 52.8 % (ref 38.4–76.8)
Platelets: 310 10*3/uL (ref 145–400)
WBC: 7.9 10*3/uL (ref 3.9–10.3)
lymph#: 2.7 10*3/uL (ref 0.9–3.3)

## 2012-12-29 NOTE — Telephone Encounter (Signed)
, °

## 2012-12-29 NOTE — Progress Notes (Signed)
OFFICE PROGRESS NOTE  CC  [name], MD [address] [place] [address] Dr. Avel Peace  DIAGNOSIS: 57 year old female with diagnosis of bilateral breast cancers.  PRIOR THERAPY:  #1patient was originally seen in the multidisciplinary breast clinic at which time she was found to have a right-sided breast cancer T1 C. N0. 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 Because of this she went on to have bilateral mastectomies with axillary lymph node biopsies.  #2 her final pathology reveals right invasive ductal carcinoma, grade one measuring 1.4 cm ER positive nodes were negative.ER positive PR positive HER-2/neu negative with Ki-67 of 5%.  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  #4 patient is status post bilateral mastectomies on 02/01/2013 with the final pathology revealing on the right side 1.4 cm invasive ductal carcinoma grade 1 ER positive PR positive HER-2/neu negative Ki-67 5%. Sentinel node was negative for metastatic disease. She is also status post simple mastectomy on the left breast at the final pathology revealing a DCIS.  #5 patient will now begin antiestrogen therapy with tamoxifen 20 mg daily starting 03/24/2012. Risks and benefits of tamoxifen were discussed with the patient. A total of 5-10 years of therapy is planned.  #6 patient is also refer her to genetic counseling and testing  CURRENT THERAPY:tamoxifen 20 mg daily  INTERVAL HISTORY: Kerri Carpenter 57 y.o. female returns for followup visit. She is doing well.  She is taking the Tamoxifen daily.  She has noticed fatigue and weight gain since January.  She has had the fatigue for about a month as well.  She denies joint aches/pains, swelling, vaginal bleeding, or any further concerns.   Patient is working full-time. 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 She denies any abdominal pain no diarrhea or constipation no headaches no shortness of breath chest pains no palpitations no peripheral paresthesias. She has not noticed any lower extremity swelling. Remainder of the 10 point review of systems is negative.  MEDICAL HISTORY: Past Medical History  Diagnosis Date  . Hypertension   . Breast cancer     Previous Right breast cancer 2000  . Anxiety     anxiety attacks recent  . GERD (gastroesophageal reflux disease)     hx    ALLERGIES:  is allergic to aspirin and penicillins.  MEDICATIONS:  Current Outpatient Prescriptions  Medication Sig Dispense Refill  . cetirizine (ZYRTEC) 10 MG tablet Take 10 mg by mouth daily.      Marland Kitchen lisinopril-hydrochlorothiazide (PRINZIDE,ZESTORETIC) 10-12.5 MG per tablet Take 1 tablet by mouth daily.      . metFORMIN (GLUCOPHAGE) 500 MG tablet Take 500 mg by mouth daily with breakfast.      . rosuvastatin (CRESTOR) 10 MG tablet Take 10 mg by mouth daily.      . tamoxifen (NOLVADEX) 20 MG tablet Take 1 tablet (20 mg total) by mouth daily.  90 tablet  12   No current facility-administered medications for this visit.    SURGICAL HISTORY:  Past Surgical History  Procedure Laterality Date  . Abdominal hysterectomy    . Breast lumpectomy  2000  . Axillary lymph node dissection  02/02/2012    Procedure: AXILLARY LYMPH NODE DISSECTION;  Surgeon: Adolph Pollack, MD;  Location: [facility] OR;  Service:  General;  Laterality: Right;  Bilateral mastectomy; left axillary sentinel node biopsy; right axillary lymph node dissection.   . Total mastectomy  02/02/2012    Procedure: TOTAL MASTECTOMY;  Surgeon: Adolph Pollack, MD;  Location: [facility] OR;  Service: General;  Laterality: Bilateral;  Bilateral mastectomy; left axillary sentinel node biopsy; right axillary lymph node dissection.   Marland Kitchen Axillary sentinel node biopsy  02/02/2012    Procedure: AXILLARY SENTINEL NODE BIOPSY;  Surgeon: Adolph Pollack,  MD;  Location: [facility] OR;  Service: General;  Laterality: Left;  Bilateral mastectomy; left axillary sentinel node biopsy; right axillary lymph node dissection.     REVIEW OF SYSTEMS:  General: fatigue (-), night sweats (-), fever (-), pain (-) Lymph: palpable nodes (-) HEENT: vision changes (-), mucositis (-), gum bleeding (-), epistaxis (-) Cardiovascular: chest pain (-), palpitations (-) Pulmonary: shortness of breath (-), dyspnea on exertion (-), cough (-), hemoptysis (-) GI:  Early satiety (-), melena (-), dysphagia (-), nausea/vomiting (-), diarrhea (-) GU: dysuria (-), hematuria (-), incontinence (-) Musculoskeletal: joint swelling (-), joint pain (-), back pain (-) Neuro: weakness (-), numbness (-), headache (-), confusion (-) Skin: Rash (-), lesions (-), dryness (-) Psych: depression (-), suicidal/homicidal ideation (-), feeling of hopelessness (-)   Health Maintenance   PHYSICAL EXAMINATION: Blood pressure 95/58, pulse 79, temperature 98.2 F (36.8 C), temperature source Oral, resp. rate 20, height 4\' 11"  (1.499 m), weight 132 lb 14.4 oz (60.283 kg). Body mass index is 26.83 kg/(m^2). General: Patient is a well appearing female in no acute distress HEENT: PERRLA, sclerae anicteric no conjunctival pallor, MMM Neck: supple, no palpable adenopathy Lungs: clear to auscultation bilaterally, no wheezes, rhonchi, or rales Cardiovascular: regular rate rhythm, S1, S2, no murmurs, rubs or gallops Abdomen: Soft, non-tender, non-distended, normoactive bowel sounds, no HSM Extremities: warm and well perfused, no clubbing, cyanosis, or edema Skin: No rashes or lesions Neuro: Non-focal Breasts: ECOG PERFORMANCE STATUS: 1 - Symptomatic but completely ambulatory  LABORATORY DATA: Lab Results  Component Value Date   WBC 7.9 12/29/2012   HGB 10.7* 12/29/2012   HCT 32.8* 12/29/2012   MCV 82.8 12/29/2012   PLT 310 12/29/2012      Chemistry      Component Value Date/Time   NA 141 06/28/2012  1433   NA 140 01/29/2012 0951   K 3.2* 06/28/2012 1433   K 3.6 01/29/2012 0951   CL 106 06/28/2012 1433   CL 102 01/29/2012 0951   CO2 23 06/28/2012 1433   CO2 27 01/29/2012 0951   BUN 11.4 06/28/2012 1433   BUN 9 01/29/2012 0951   CREATININE 0.8 06/28/2012 1433   CREATININE 0.74 01/29/2012 0951      Component Value Date/Time   CALCIUM 8.8 06/28/2012 1433   CALCIUM 9.9 01/29/2012 0951   ALKPHOS 79 06/28/2012 1433   ALKPHOS 76 01/29/2012 0951   AST 14 06/28/2012 1433   AST 21 01/29/2012 0951   ALT 15 06/28/2012 1433   ALT 21 01/29/2012 0951   BILITOT <0.20 Repeated and Verified 06/28/2012 1433   BILITOT 0.3 01/29/2012 0951    02/02/12 REASON FOR ADDENDUM, AMENDMENT OR CORRECTION: [id number]: part 3 oncology table: TNM code should be pTis, pN0. ADDITIONAL INFORMATION: 4. CHROMOGENIC IN-SITU HYBRIDIZATION Interpretation HER-2/NEU BY CISH - NO AMPLIFICATION OF HER-2 DETECTED. THE RATIO OF HER-2: CEP 17 SIGNALS WAS 1.30. Reference range: Ratio: HER2:CEP17 < 1.8 - gene amplification not observed Ratio: HER2:CEP 17 1.8-2.2 - equivocal result Ratio: HER2:CEP17 > 2.2 - gene amplification  observed Pecola Leisure MD Pathologist, Electronic Signature ( Signed 02/09/2012) FINAL DIAGNOSIS Diagnosis 1. Lymph node, sentinel, biopsy, Left axillary - ONE LYMPH NODE, NEGATIVE FOR TUMOR (0/1). 2. Lymph nodes, regional resection, Right axillary - ONE LYMPH NODE, NEGATIVE FOR TUMOR (0/1). 3. Breast, simple mastectomy, Left - HIGH GRADE DUCTAL CARCINOMA IN SITU, SEE COMMENT. - IN SITU CARCINOMA IS 1.3 CM FROM NEAREST MARGIN (DEEP). 1 of 4 Amended copy Corrected FINAL for KEMPER, HOCHMAN [phone]) Diagnosis(continued) 4. Breast, simple mastectomy, Right - INVASIVE DUCTAL CARCINOMA, GRADE I (1.4 CM). - INVASIVE TUMOR IS FOCALLY PRESENT AT ANTERIOR MARGIN - NO LYMPHOVASCULAR INVASION IDENTIFIED. - SEE TUMOR SYNOPTIC TEMPLATE BELOW. Microscopic Comment 3. BREAST, IN SITU CARCINOMA Specimen, including  laterality: Left breast Procedure: Simple mastectomy Grade of carcinoma: III Necrosis: Present Estimated tumor size: (gross measurement or glass slide measurement): See comment Treatment effect: None If present, treatment effect in breast tissue, lymph nodes or both: N/A Distance to closest margin: 1.3 cm If margin positive, focally or broadly: N/A Breast prognostic profile: Not repeated Estrogen receptor: Previous study demonstrated 51% positivity ([id number]) Progesterone receptor: Previous study demonstrated 10% positivity ([id number]) Lymph nodes: Examined: One (part 1) Lymph nodes with metastasis: 0 TNM: pTis, pN0 Comments: Multiple foci of ductal carcinoma in situ are identified involving the 1.2 cm indurated area associated with the dumb bell shaped clip as well as the additional 2.8 cm area of induration not associated with clips (see gross description). Additional non-neoplastic findings include fibrocystic change with usual ductal hyperplasia, sclerosing adenosis, fibroadenomatoid nodules, and microcalcifications in benign ducts and lobules. 4. BREAST, INVASIVE TUMOR, WITH LYMPH NODE SAMPLING Specimen, including laterality: Right breast Procedure: Simple mastectomy Grade: I of III Tubule formation: 1 Nuclear pleomorphism: 1 Mitotic: 1 Tumor size (gross measurement): 1.4 cm Margins: Invasive, distance to closest margin: Present, anterior margin . In-situ, distance to closest margin: n/a If margin positive, focally or broadly: Focally Lymphovascular invasion: Absent Ductal carcinoma in situ: Absent Grade: N/A Extensive intraductal component: N/A Lobular neoplasia: Present, atypical hyperplasia Tumor focality: Unifocal Treatment effect: None If present, treatment effect in breast tissue, lymph nodes or both: N/A Extent of tumor: Skin: Negative for tumor Nipple: Negative for tumor 2 of 4 Amended copy Corrected FINAL for SHIREL, MALLIS F  [phone]) Microscopic Comment(continued) Skeletal muscle: N/A Lymph nodes: # examined: 1 (part 2) Lymph nodes with metastasis: 0 Breast prognostic profile: Estrogen receptor: Not repeated, previous study demonstrated 99% positivity ([id number]) Progesterone receptor: Not repeated, previous study demonstrated 51% positivity ([id number]) Her 2 neu: Repeated, previous study demonstrate no amplification ([id number]) Ki-67: Not repeated, previous study demonstrated 5% proliferation rate ([id number]) Non-neoplastic breast: Previous biopsy site, fibrocystic change with usual ductal hyperplasia and microcalcifications in benign ducts and lobules TNM: pT1c, pN0, pMX Comments: Invasive tumor focally involves the cauterized tissue edge in slide 4A. Following review of the mastectomy with the Pathologist's Assistant who grossed the case, slide section 4A was determined to be from the anterior margin. In addition to the tumor being present at the anterior margin, invasive tumor is less than 0.1 mm from the nearest anterior margin in a separate slide section (slide F). (CR:kh 02-04-12) Pecola Leisure MD Pathologist, Electronic Signature (Case signed 02/08/2012) Specimen Gross and Clinical Information Specimen(s) Obtained: 1. Lymph node, sentinel, biopsy, Left axillary 2. Lymph nodes, regional resection, Right axillary 3. Breast, simple mastectomy, Left 4. Breast, simple mastectomy, Right Specimen Clinical Information 1. Bilateral breast cancer (tl) Gross 1. Rapid Intraoperative Consult performed (Yes or No): No. Specimen: Left axillary  sentinel node. Number and size: One node, 1.6 cm. Cut Surface(s): Tan pink, soft to rubbery, non blue. Block Summary: The node is sectioned and entirely submitted in one block labeled SLN for routine histology. 2. Received fresh is a 6 x 5 x 2 cm aggregate of fat within which are found eight possible lymph node ranging from 0.2 to 2.3 cm, and are entirely  submitted as follows: A= three nodes, whole. B= four nodes, whole. C= largest node, bisected. Total 3 blocks. 3. Specimen: Left simple mastectomy, suture at medial. Specimen integrity (intact/disrupted): Intact. Weight: 397 grams. Size: 15 x 15 x 4.3 cm. Skin: There is an 11 x 4 cm ellipse of tan brown skin, with a 3 cm in diameter unremarkable nipple and surrounding areola. Tumor/cavity: Within the mid to inferior lateral specimen is a dumb-bell shaped clip, which is surrounded by tan yellow to red brown indurated tissue, 1.2 x 1 x 0.8 cm. A discrete lesion or mass is not identified. This area of indurated tissue with clip is 1.3 cm from the deep margin. Located 1 cm lateral to this area of tissue is a 2.8 x 2.3 x 2 cm area of tan yellow to red brown tissue with scattered induration, but no additional 3 of 4 Amended copy Corrected FINAL for Carpenter, Trenesha  RADIOGRAPHIC STUDIES:  Nm Sentinel Node Inj-no Rpt (breast)  02/02/2012  CLINICAL DATA: bilateral breast cancer   Sulfur colloid was injected intradermally by the nuclear medicine  technologist for breast cancer sentinel node localization.      ASSESSMENT: 57 year old female with  #1 bilateral breast cancers with the right breast being DCIS and left breast invasive ductal carcinoma that is ER positive HER-2/neu negative. Patient is now status post bilateral mastectomies. She does not desire reconstruction.  #2 patient is on adjuvant antiestrogen therapy with tamoxifen 20 mg daily. She's tolerating it well. Patient has no evidence of recurrent disease   PLAN:   #1 Doing well.  No sign of recurrence.  Continue Tamoxifen.    #2 I will see her back in 6 month's time in followup.  All questions were answered. The patient knows to call the clinic with any problems, questions or concerns. We can certainly see the patient much sooner if necessary.  I spent 15 minutes counseling the patient face to face. The total time spent in  the appointment was 15 minutes.  Drue Second, MD Medical/Oncology [facility] [phone] (beeper) [phone] (Office)  12/29/2012, 3:38 PM

## 2013-01-02 ENCOUNTER — Ambulatory Visit (INDEPENDENT_AMBULATORY_CARE_PROVIDER_SITE_OTHER): Payer: 59 | Admitting: General Surgery

## 2013-01-02 ENCOUNTER — Encounter (INDEPENDENT_AMBULATORY_CARE_PROVIDER_SITE_OTHER): Payer: Self-pay | Admitting: General Surgery

## 2013-01-02 VITALS — BP 120/68 | HR 72 | Temp 97.5°F | Resp 14 | Ht 59.5 in | Wt 132.4 lb

## 2013-01-02 DIAGNOSIS — C50919 Malignant neoplasm of unspecified site of unspecified female breast: Secondary | ICD-10-CM

## 2013-01-02 DIAGNOSIS — C50912 Malignant neoplasm of unspecified site of left female breast: Secondary | ICD-10-CM

## 2013-01-02 NOTE — Patient Instructions (Signed)
Call if you discover any new chest wall nodules. 

## 2013-01-02 NOTE — Progress Notes (Signed)
Procedure:  Bilateral mastectomies and axillary lymph nodes biopsies.  Date:  02/02/12  Pathology:  Right breast is T1cN0, Left breast is Tis.  History:  She is here for followup of her bilateral breast cancers. She is doing well. She missed her last visit because she did not have her co-pay. She denies any chest wall nodules. No adenopathy.  Exam: General- Is in NAD. Chest-bilateral chest wall scars are present with no nodules palpable.  Lymph nodes-no palpable cervical, supraclavicular, or axillary adenopathy.  Assessment:  Bilateral breast cancer with right being invasive and left being DCIS-No clinical evidence of recurrence; has a hx of mild right arm lymphedema.  Plan:  RTC in 3 months.

## 2013-01-03 ENCOUNTER — Telehealth: Payer: Self-pay | Admitting: *Deleted

## 2013-01-03 MED ORDER — ERGOCALCIFEROL 1.25 MG (50000 UT) PO CAPS: 50000.0000 [IU] | ORAL_CAPSULE | ORAL | Status: DC

## 2013-01-03 NOTE — Telephone Encounter (Signed)
Per NP called pt lmovm to begin ergocalciferol 50,000units po weekly. Q# 4 R-2. When this rx runs out pt to begin Vitamin D3 2000 units daily. Request pt call back to confirm message received and rx has been picked up.

## 2013-03-14 ENCOUNTER — Encounter (INDEPENDENT_AMBULATORY_CARE_PROVIDER_SITE_OTHER): Payer: Self-pay | Admitting: General Surgery

## 2013-04-04 ENCOUNTER — Encounter (INDEPENDENT_AMBULATORY_CARE_PROVIDER_SITE_OTHER): Payer: Self-pay | Admitting: General Surgery

## 2013-04-04 ENCOUNTER — Ambulatory Visit (INDEPENDENT_AMBULATORY_CARE_PROVIDER_SITE_OTHER): Payer: 59 | Admitting: General Surgery

## 2013-04-04 VITALS — BP 118/70 | HR 80 | Temp 97.9°F | Resp 20 | Ht 59.5 in | Wt 134.6 lb

## 2013-04-04 DIAGNOSIS — C50919 Malignant neoplasm of unspecified site of unspecified female breast: Secondary | ICD-10-CM

## 2013-04-04 NOTE — Progress Notes (Signed)
Procedure:  Bilateral mastectomies and axillary lymph nodes biopsies.  Date:  02/02/12  Pathology:  Right breast is T1cN0, Left breast is Tis.  History:  She is here for followup of her bilateral breast cancers. She feels well and has no complaints. She is due to see Dr. Humphrey Rolls in May of this year. She denies any chest wall nodules. No adenopathy.  Exam: General- Is in NAD. Chest-bilateral chest wall scars are present with no nodules palpable.  Lymph nodes-no palpable cervical, supraclavicular, or axillary adenopathy.  Extr-Lymphedema sleeve on the right upper extremity.  Assessment:  Bilateral breast cancer with right being invasive and left being DCIS-No clinical evidence of recurrence; has a hx of mild right arm lymphedema.  Plan:  RTC in 6 months.

## 2013-04-04 NOTE — Patient Instructions (Signed)
Call if you feel any new nodules on your chest wall.

## 2013-04-18 ENCOUNTER — Other Ambulatory Visit: Payer: Self-pay | Admitting: Oncology

## 2013-06-26 ENCOUNTER — Telehealth: Payer: Self-pay | Admitting: Hematology and Oncology

## 2013-06-26 NOTE — Telephone Encounter (Signed)
, °

## 2013-07-03 ENCOUNTER — Other Ambulatory Visit: Payer: 59

## 2013-07-03 ENCOUNTER — Ambulatory Visit: Payer: 59 | Admitting: Oncology

## 2013-07-20 ENCOUNTER — Other Ambulatory Visit: Payer: Self-pay | Admitting: Oncology

## 2013-07-20 DIAGNOSIS — C50519 Malignant neoplasm of lower-outer quadrant of unspecified female breast: Secondary | ICD-10-CM

## 2013-08-02 ENCOUNTER — Ambulatory Visit (HOSPITAL_BASED_OUTPATIENT_CLINIC_OR_DEPARTMENT_OTHER): Payer: 59 | Admitting: Oncology

## 2013-08-02 ENCOUNTER — Encounter: Payer: Self-pay | Admitting: Oncology

## 2013-08-02 ENCOUNTER — Other Ambulatory Visit (HOSPITAL_BASED_OUTPATIENT_CLINIC_OR_DEPARTMENT_OTHER): Payer: 59

## 2013-08-02 VITALS — BP 119/77 | HR 80 | Temp 98.2°F | Resp 18 | Ht 59.5 in | Wt 133.7 lb

## 2013-08-02 DIAGNOSIS — C50511 Malignant neoplasm of lower-outer quadrant of right female breast: Secondary | ICD-10-CM

## 2013-08-02 DIAGNOSIS — C50519 Malignant neoplasm of lower-outer quadrant of unspecified female breast: Secondary | ICD-10-CM

## 2013-08-02 DIAGNOSIS — Z170 Estrogen receptor positive status [ER+]: Secondary | ICD-10-CM

## 2013-08-02 LAB — CBC WITH DIFFERENTIAL/PLATELET
BASO%: 0.8 % (ref 0.0–2.0)
BASOS ABS: 0.1 10*3/uL (ref 0.0–0.1)
EOS%: 3.1 % (ref 0.0–7.0)
Eosinophils Absolute: 0.2 10*3/uL (ref 0.0–0.5)
HEMATOCRIT: 33.8 % — AB (ref 34.8–46.6)
HEMOGLOBIN: 11.1 g/dL — AB (ref 11.6–15.9)
LYMPH#: 2.3 10*3/uL (ref 0.9–3.3)
LYMPH%: 28.4 % (ref 14.0–49.7)
MCH: 27.1 pg (ref 25.1–34.0)
MCHC: 32.8 g/dL (ref 31.5–36.0)
MCV: 82.7 fL (ref 79.5–101.0)
MONO#: 0.8 10*3/uL (ref 0.1–0.9)
MONO%: 9.9 % (ref 0.0–14.0)
NEUT#: 4.7 10*3/uL (ref 1.5–6.5)
NEUT%: 57.8 % (ref 38.4–76.8)
PLATELETS: 316 10*3/uL (ref 145–400)
RBC: 4.09 10*6/uL (ref 3.70–5.45)
RDW: 15.3 % — ABNORMAL HIGH (ref 11.2–14.5)
WBC: 8.1 10*3/uL (ref 3.9–10.3)

## 2013-08-02 LAB — COMPREHENSIVE METABOLIC PANEL (CC13)
ALT: 12 U/L (ref 0–55)
ANION GAP: 9 meq/L (ref 3–11)
AST: 16 U/L (ref 5–34)
Albumin: 3.3 g/dL — ABNORMAL LOW (ref 3.5–5.0)
Alkaline Phosphatase: 57 U/L (ref 40–150)
BILIRUBIN TOTAL: 0.21 mg/dL (ref 0.20–1.20)
BUN: 10.5 mg/dL (ref 7.0–26.0)
CALCIUM: 9.3 mg/dL (ref 8.4–10.4)
CHLORIDE: 108 meq/L (ref 98–109)
CO2: 27 mEq/L (ref 22–29)
CREATININE: 0.9 mg/dL (ref 0.6–1.1)
Glucose: 95 mg/dl (ref 70–140)
Potassium: 3.6 mEq/L (ref 3.5–5.1)
Sodium: 144 mEq/L (ref 136–145)
Total Protein: 7.1 g/dL (ref 6.4–8.3)

## 2013-08-02 NOTE — Progress Notes (Signed)
[place]  Telephone:(336) [phone] Fax:(336) [phone]     ID: Kerri Carpenter OB: 1955/08/10  MR#: [id number]  JSE#:[id number]  Patient Care Team: Lucianne Lei, MD as PCP - General (Family Medicine)  CHIEF COMPLAINT:  Chief Complaint  Patient presents with  . Breast Cancer  . Follow-up    BREAST CANCER HISTORY:  #1patient was originally seen in the multidisciplinary breast clinic at which time she was found to have a right-sided breast cancer T1 C. N0. 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 Because of this she went on to have bilateral mastectomies with axillary lymph node biopsies.  #2 her final pathology reveals right invasive ductal carcinoma, grade one measuring 1.4 cm ER positive nodes were negative.ER positive PR positive HER-2/neu negative with Ki-67 of 5%.  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  #4 patient is status post bilateral mastectomies on 02/01/2013 with the final pathology revealing on the right side 1.4 cm invasive ductal carcinoma grade 1 ER positive PR positive HER-2/neu negative Ki-67 5%. Sentinel node was negative for metastatic disease. She is also status post simple mastectomy on the left breast at the final pathology revealing a DCIS.  #5 patient will now begin antiestrogen therapy with tamoxifen 20 mg daily starting 03/24/2012. Risks and benefits of tamoxifen were discussed with the patient. A total of 5-10 years of therapy is planned.     INTERVAL HISTORY:  Patient Returns to clinic today for routine six-month followup.  She is tolerating tamoxifen without significant side effects, athough she does admit to occasional arthralgias.  She denies any recent fevers or illnesses. She has a good appetite and denies weight loss.  She continues to be active and work full-time.  She has no neurologic complaints.   She denies any chest pain or shortness of breath. She has no nausea, vomiting, constipation, or diarrhea.  She denies any urinary complaints.  Patient feels at her baseline and offers no further specific complaints today.  REVIEW OF SYSTEMS:  As per HPI. Otherwise, 10 point system review was negative.  PAST MEDICAL HISTORY: Past Medical History  Diagnosis Date  . Hypertension   . Breast cancer     Previous Right breast cancer 2000  . Anxiety     anxiety attacks recent  . GERD (gastroesophageal reflux disease)     hx    PAST SURGICAL HISTORY: Past Surgical History  Procedure Laterality Date  . Abdominal hysterectomy    . Breast lumpectomy  2000  . Axillary lymph node dissection  02/02/2012    Procedure: AXILLARY LYMPH NODE DISSECTION;  Surgeon: [name], MD;  Location: [place];  Service: General;  Laterality: Right;  Bilateral mastectomy; left axillary sentinel node biopsy; right axillary lymph node dissection.   . Total mastectomy  02/02/2012    Procedure: TOTAL MASTECTOMY;  Surgeon: [name], MD;  Location: [place];  Service: General;  Laterality: Bilateral;  Bilateral mastectomy; left axillary sentinel node biopsy; right axillary lymph node dissection.   Marland Kitchen Axillary sentinel node biopsy  02/02/2012    Procedure: AXILLARY SENTINEL NODE BIOPSY;  Surgeon: [name], MD;  Location: [place];  Service: General;  Laterality: Left;  Bilateral mastectomy; left axillary sentinel node biopsy; right axillary lymph node dissection.     FAMILY HISTORY Family History  Problem Relation Age  of Onset  . Lung cancer Paternal Aunt     GYNECOLOGIC HISTORY:  No LMP recorded. Patient has had a hysterectomy.      ADVANCED DIRECTIVES:    HEALTH MAINTENANCE: History  Substance Use Topics  . Smoking status: Current Every Day Smoker -- 0.50 packs/day for 39 years    Types: Cigarettes  . Smokeless tobacco: Never Used     Comment: social drinker  . Alcohol Use: Yes      Colonoscopy:  PAP:  Bone density:  Lipid panel:  Allergies  Allergen Reactions  . Aspirin Other (See Comments)    Upset stomach  . Penicillins     Current Outpatient Prescriptions  Medication Sig Dispense Refill  . cetirizine (ZYRTEC) 10 MG tablet Take 10 mg by mouth daily.      Marland Kitchen lisinopril-hydrochlorothiazide (PRINZIDE,ZESTORETIC) 10-12.5 MG per tablet Take 1 tablet by mouth daily.      . metFORMIN (GLUCOPHAGE) 500 MG tablet Take 500 mg by mouth daily with breakfast.      . rosuvastatin (CRESTOR) 10 MG tablet Take 10 mg by mouth daily.      . tamoxifen (NOLVADEX) 20 MG tablet TAKE ONE TABLET BY MOUTH ONCE DAILY  90 tablet  0   No current facility-administered medications for this visit.    OBJECTIVE: Filed Vitals:   08/02/13 1025  BP: 119/77  Pulse: 80  Temp: 98.2 F (36.8 C)  Resp: 18     Body mass index is 26.56 kg/(m^2).    ECOG FS:0 - Asymptomatic  General: Well-developed, well-nourished, no acute distress. Eyes: Pink conjunctiva, anicteric sclera. Breasts: Bilateral mastectomy.  Bilateral chest wall max well without evidence of recurrence. Lungs: Clear to auscultation bilaterally. Heart: Regular rate and rhythm. No rubs, murmurs, or gallops. Abdomen: Soft, nontender, nondistended. No organomegaly noted, normoactive bowel sounds. Musculoskeletal: No edema, cyanosis, or clubbing. Neuro: Alert, answering all questions appropriately. Cranial nerves grossly intact. Skin: No rashes or petechiae noted. Psych: Normal affect.   LAB RESULTS:  CMP     Component Value Date/Time   NA 144 08/02/2013 0951   NA 140 01/29/2012 0951   K 3.6 08/02/2013 0951   K 3.6 01/29/2012 0951   CL 106 06/28/2012 1433   CL 102 01/29/2012 0951   CO2 27 08/02/2013 0951   CO2 27 01/29/2012 0951   GLUCOSE 95 08/02/2013 0951   GLUCOSE 119* 06/28/2012 1433   GLUCOSE 102* 01/29/2012 0951   BUN 10.5 08/02/2013 0951   BUN 9 01/29/2012 0951   CREATININE 0.9 08/02/2013 0951   CREATININE 0.74  01/29/2012 0951   CALCIUM 9.3 08/02/2013 0951   CALCIUM 9.9 01/29/2012 0951   PROT 7.1 08/02/2013 0951   PROT 7.9 01/29/2012 0951   ALBUMIN 3.3* 08/02/2013 0951   ALBUMIN 3.9 01/29/2012 0951   AST 16 08/02/2013 0951   AST 21 01/29/2012 0951   ALT 12 08/02/2013 0951   ALT 21 01/29/2012 0951   ALKPHOS 57 08/02/2013 0951   ALKPHOS 76 01/29/2012 0951   BILITOT 0.21 08/02/2013 0951   BILITOT 0.3 01/29/2012 0951   GFRNONAA >90 01/29/2012 0951   GFRAA >90 01/29/2012 0951    I No results found for this basename: SPEP, UPEP,  kappa and lambda light chains    Lab Results  Component Value Date   WBC 8.1 08/02/2013   NEUTROABS 4.7 08/02/2013   HGB 11.1* 08/02/2013   HCT 33.8* 08/02/2013   MCV 82.7 08/02/2013   PLT 316 08/02/2013  Chemistry      Component Value Date/Time   NA 144 08/02/2013 0951   NA 140 01/29/2012 0951   K 3.6 08/02/2013 0951   K 3.6 01/29/2012 0951   CL 106 06/28/2012 1433   CL 102 01/29/2012 0951   CO2 27 08/02/2013 0951   CO2 27 01/29/2012 0951   BUN 10.5 08/02/2013 0951   BUN 9 01/29/2012 0951   CREATININE 0.9 08/02/2013 0951   CREATININE 0.74 01/29/2012 0951      Component Value Date/Time   CALCIUM 9.3 08/02/2013 0951   CALCIUM 9.9 01/29/2012 0951   ALKPHOS 57 08/02/2013 0951   ALKPHOS 76 01/29/2012 0951   AST 16 08/02/2013 0951   AST 21 01/29/2012 0951   ALT 12 08/02/2013 0951   ALT 21 01/29/2012 0951   BILITOT 0.21 08/02/2013 0951   BILITOT 0.3 01/29/2012 0951       Lab Results  Component Value Date   LABCA2 26 01/06/2012    No components found with this basename: LABCA125    No results found for this basename: INR,  in the last 168 hours  Urinalysis No results found for this basename: colorurine, appearanceur, labspec, phurine, glucoseu, hgbur, bilirubinur, ketonesur, proteinur, urobilinogen, nitrite, leukocytesur    STUDIES: No results found.  ASSESSMENT:  Bilateral breast cancer: Right breast with DCIS and left breast invasive ductal carcinoma that is ER positive  HER-2/neu negative. Patient is now status post bilateral mastectomies.   PLAN:   1.  Breast cancer: No evidence of recurrence.  Continue to tamoxifen for a total of 5 years completing in January of 2019.  Because of her bilateral mastectomy, patient no longer requires mammograms.  Patient will return to clinic in 6 months for routine followup. She has requested transfer her care to [facility].  She understands that she can call clinic at any time if she has any questions, concerns, or complaints.   Lloyd Huger, MD   08/02/2013 2:20 PM

## 2013-09-25 ENCOUNTER — Other Ambulatory Visit: Payer: Self-pay

## 2013-09-25 DIAGNOSIS — C50519 Malignant neoplasm of lower-outer quadrant of unspecified female breast: Secondary | ICD-10-CM

## 2013-09-25 MED ORDER — TAMOXIFEN CITRATE 20 MG PO TABS: ORAL_TABLET | ORAL | Status: DC

## 2013-09-25 NOTE — Telephone Encounter (Signed)
Patient stated that she has not heard from Dr. Gary Fleet office at [facility] for appointment there in follow up for this fall. Gave phone number for [facility].  Told her that Dr. Grayland Ormond was out on a medical leave recently so that may be why she has not heard form his office. She can call back to the [facility] to make an appointment here if she changes her mind.

## 2013-10-03 ENCOUNTER — Telehealth: Payer: Self-pay | Admitting: Oncology

## 2013-10-03 ENCOUNTER — Encounter (INDEPENDENT_AMBULATORY_CARE_PROVIDER_SITE_OTHER): Payer: Self-pay | Admitting: General Surgery

## 2013-10-03 NOTE — Telephone Encounter (Signed)
Sent records to Dr.T. Caldwell office. 10/03/13

## 2013-10-31 ENCOUNTER — Ambulatory Visit: Payer: Self-pay | Admitting: Oncology

## 2013-11-15 ENCOUNTER — Ambulatory Visit (INDEPENDENT_AMBULATORY_CARE_PROVIDER_SITE_OTHER): Payer: 59 | Admitting: General Surgery

## 2013-11-23 ENCOUNTER — Ambulatory Visit: Payer: Self-pay | Admitting: Oncology

## 2014-01-20 ENCOUNTER — Encounter (HOSPITAL_COMMUNITY): Payer: Self-pay | Admitting: Emergency Medicine

## 2014-01-20 ENCOUNTER — Emergency Department (HOSPITAL_COMMUNITY)
Admission: EM | Admit: 2014-01-20 | Discharge: 2014-01-20 | Disposition: A | Discharge status: Home / Self Care | Payer: 59 | Attending: Emergency Medicine | Admitting: Emergency Medicine

## 2014-01-20 DIAGNOSIS — Z8719 Personal history of other diseases of the digestive system: Secondary | ICD-10-CM | POA: Insufficient documentation

## 2014-01-20 DIAGNOSIS — Z720 Tobacco use: Secondary | ICD-10-CM | POA: Diagnosis not present

## 2014-01-20 DIAGNOSIS — Z880 Allergy status to penicillin: Secondary | ICD-10-CM | POA: Diagnosis not present

## 2014-01-20 DIAGNOSIS — Z79899 Other long term (current) drug therapy: Secondary | ICD-10-CM | POA: Diagnosis not present

## 2014-01-20 DIAGNOSIS — R05 Cough: Secondary | ICD-10-CM | POA: Diagnosis present

## 2014-01-20 DIAGNOSIS — Z79810 Long term (current) use of selective estrogen receptor modulators (SERMs): Secondary | ICD-10-CM | POA: Diagnosis not present

## 2014-01-20 DIAGNOSIS — R0982 Postnasal drip: Secondary | ICD-10-CM

## 2014-01-20 DIAGNOSIS — I10 Essential (primary) hypertension: Secondary | ICD-10-CM | POA: Diagnosis not present

## 2014-01-20 DIAGNOSIS — Z8659 Personal history of other mental and behavioral disorders: Secondary | ICD-10-CM | POA: Insufficient documentation

## 2014-01-20 DIAGNOSIS — J069 Acute upper respiratory infection, unspecified: Secondary | ICD-10-CM | POA: Diagnosis not present

## 2014-01-20 DIAGNOSIS — H9201 Otalgia, right ear: Secondary | ICD-10-CM | POA: Insufficient documentation

## 2014-01-20 DIAGNOSIS — Z853 Personal history of malignant neoplasm of breast: Secondary | ICD-10-CM | POA: Diagnosis not present

## 2014-01-20 DIAGNOSIS — F17200 Nicotine dependence, unspecified, uncomplicated: Secondary | ICD-10-CM

## 2014-01-20 MED ORDER — HYDROCODONE-ACETAMINOPHEN 5-325 MG PO TABS: ORAL_TABLET | ORAL | Status: DC

## 2014-01-20 MED ORDER — FLUTICASONE PROPIONATE 50 MCG/ACT NA SUSP: 2.0000 | Freq: Every day | NASAL | Status: DC

## 2014-01-20 NOTE — ED Notes (Signed)
Pt c/o productive cough x 3 wks w/ white mucous.  Pt states that she is also having sore throat and rt sided ear pain.

## 2014-01-20 NOTE — Discharge Instructions (Signed)
Use nasal saline (you can try [employer] Simply Saline) at least 4 times a day, use saline 5-10 minutes before using the fluticasone (flonase)  Do not use Afrin (Oxymetazoline)  Rest, wash hands frequently  and drink plenty of water.  Please follow with your primary care doctor in the next 2 days for a check-up. They must obtain records for further management.   Do not hesitate to return to the Emergency Department for any new, worsening or concerning symptoms.

## 2014-01-20 NOTE — ED Provider Notes (Signed)
CSN: [id number]     Arrival date & time 01/20/14  1516 History  This chart was scribed for non-physician practitioner, Monico Blitz, PA-C, working with [place], DO, by Delphia Grates, ED Scribe. This patient was seen in room WTR8/WTR8 and the patient's care was started at 3:37 PM.   Chief Complaint  Patient presents with  . Cough  . Otalgia  . Sore Throat    The history is provided by the patient. No language interpreter was used.     HPI Comments: Kerri Carpenter is a 58 y.o. female who presents to the Emergency Department complaining of persistent cough for the past 3 weeks. Patient states the cough was initially productive of white sputum, but states this later transitioned to a dry cough. There is associated postnasal drip, sore throat, and right ear pain. She reports the cough is worse at night. Patient has been using saline 1-2 times per day for relief of nasal congestion. She also reports using chloroacetic but states she has not been taking this as directed and is unsure of the effectiveness. She denies SOB, fever, chills, CP, nausea, or vomiting. Patient is current smoker with history of HTN, GERD, breast cancer (right breast; 2000), and anxiety.  Past Medical History  Diagnosis Date  . Hypertension   . Breast cancer     Previous Right breast cancer 2000  . Anxiety     anxiety attacks recent  . GERD (gastroesophageal reflux disease)     hx   Past Surgical History  Procedure Laterality Date  . Abdominal hysterectomy    . Breast lumpectomy  2000  . Axillary lymph node dissection  02/02/2012    Procedure: AXILLARY LYMPH NODE DISSECTION;  Surgeon: [name], MD;  Location: [place];  Service: General;  Laterality: Right;  Bilateral mastectomy; left axillary sentinel node biopsy; right axillary lymph node dissection.   . Total mastectomy  02/02/2012    Procedure: TOTAL MASTECTOMY;  Surgeon: [name], MD;  Location: [place];  Service: General;   Laterality: Bilateral;  Bilateral mastectomy; left axillary sentinel node biopsy; right axillary lymph node dissection.   Marland Kitchen Axillary sentinel node biopsy  02/02/2012    Procedure: AXILLARY SENTINEL NODE BIOPSY;  Surgeon: [name], MD;  Location: [place];  Service: General;  Laterality: Left;  Bilateral mastectomy; left axillary sentinel node biopsy; right axillary lymph node dissection.    Family History  Problem Relation Age of Onset  . Lung cancer Paternal Aunt    History  Substance Use Topics  . Smoking status: Current Every Day Smoker -- 0.50 packs/day for 39 years    Types: Cigarettes  . Smokeless tobacco: Never Used     Comment: social drinker  . Alcohol Use: Yes   OB History    No data available     Review of Systems A complete 10 system review of systems was obtained and all systems are negative except as noted in the HPI and PMH.     Allergies  Aspirin and Penicillins  Home Medications   Prior to Admission medications   Medication Sig Start Date End Date Taking? Authorizing Provider  cetirizine (ZYRTEC) 10 MG tablet Take 10 mg by mouth daily.    Historical Provider, MD  fluticasone (FLONASE) 50 MCG/ACT nasal spray Place 2 sprays into both nostrils daily. 01/20/14   Bethany Hirt, PA-C  HYDROcodone-acetaminophen (NORCO/VICODIN) 5-325 MG per tablet Take 1-2 tablets by mouth every 6 hours as needed for pain/cough 01/20/14  Brinna Divelbiss, PA-C  lisinopril-hydrochlorothiazide (PRINZIDE,ZESTORETIC) 10-12.5 MG per tablet Take 1 tablet by mouth daily.    Historical Provider, MD  metFORMIN (GLUCOPHAGE) 500 MG tablet Take 500 mg by mouth daily with breakfast.    Historical Provider, MD  rosuvastatin (CRESTOR) 10 MG tablet Take 10 mg by mouth daily.    Historical Provider, MD  tamoxifen (NOLVADEX) 20 MG tablet TAKE ONE TABLET BY MOUTH ONCE DAILY 09/25/13   Minette Headland, NP   Triage Vitals: BP 134/71 mmHg  Pulse 94  Temp(Src) 98.2 F (36.8 C) (Oral)  Resp 16   SpO2 100%  Physical Exam  Constitutional: She is oriented to person, place, and time. She appears well-developed and well-nourished. No distress.  HENT:  Head: Normocephalic and atraumatic.  Nose: Rhinorrhea present. Right sinus exhibits no maxillary sinus tenderness and no frontal sinus tenderness. Left sinus exhibits no maxillary sinus tenderness and no frontal sinus tenderness.  Mouth/Throat: Oropharynx is clear and moist.  No drooling or stridor. Posterior pharynx mildly erythematous no significant tonsillar hypertrophy. No exudate. Soft palate rises symmetrically. No TTP or induration under tongue.   No tenderness to palpation of frontal or bilateral maxillary sinuses.  No mucosal edema in the nares.  Bilateral tympanic membranes with normal architecture and good light reflex.    Eyes: Conjunctivae and EOM are normal. Pupils are equal, round, and reactive to light.  Neck: Normal range of motion. Neck supple.  Shotty anterior cervical lymphadenopathy bilaterally  Cardiovascular: Normal rate, regular rhythm and intact distal pulses.   Pulmonary/Chest: Effort normal and breath sounds normal. No stridor. No respiratory distress. She has no wheezes. She has no rales. She exhibits no tenderness.  Abdominal: Soft. Bowel sounds are normal. She exhibits no distension and no mass. There is no tenderness. There is no rebound and no guarding.  Musculoskeletal: Normal range of motion.  Lymphadenopathy:    She has cervical adenopathy.  Neurological: She is alert and oriented to person, place, and time.  Psychiatric: She has a normal mood and affect.  Nursing note and vitals reviewed.   ED Course  Procedures (including critical care time)  DIAGNOSTIC STUDIES: Oxygen Saturation is 100% on room air, normal by my interpretation.    COORDINATION OF CARE: At 4401 Discussed treatment plan with patient which includes nasal steroid and Vicodin. Patient agrees.   Labs Review Labs Reviewed -  No data to display  Imaging Review No results found.   EKG Interpretation None      MDM   Final diagnoses:  URI (upper respiratory infection)  Post-nasal drainage  Tobacco use disorder   Filed Vitals:   01/20/14 1523  BP: 134/71  Pulse: 94  Temp: 98.2 F (36.8 C)  TempSrc: Oral  Resp: 16  SpO2: 100%    Kerri Carpenter is a 58 y.o. female presenting with rhinorrhea, sore throat and what she describes as productive cough. Patient is afebrile, no chest pain, saturating well on room air, lung sounds are clear to auscultation. Doubt pneumonia. This is likely secondary to postnasal drip. Discussed return precautions and advise close follow-up with primary care.  Evaluation does not show pathology that would require ongoing emergent intervention or inpatient treatment. Pt is hemodynamically stable and mentating appropriately. Discussed findings and plan with patient/guardian, who agrees with care plan. All questions answered. Return precautions discussed and outpatient follow up given.   Discharge Medication List as of 01/20/2014  3:48 PM    START taking these medications   Details  fluticasone (  FLONASE) 50 MCG/ACT nasal spray Place 2 sprays into both nostrils daily., Starting 01/20/2014, Until Discontinued, Print    HYDROcodone-acetaminophen (NORCO/VICODIN) 5-325 MG per tablet Take 1-2 tablets by mouth every 6 hours as needed for pain/cough, Print         I personally performed the services described in this documentation, which was scribed in my presence. The recorded information has been reviewed and is accurate.     Monico Blitz, PA-C 01/20/14 [place], DO 01/20/14 2329

## 2014-06-11 ENCOUNTER — Ambulatory Visit
Admit: 2014-06-11 | Disposition: A | Discharge status: Home / Self Care | Payer: Self-pay | Attending: Oncology | Admitting: Oncology

## 2014-06-11 LAB — CBC CANCER CENTER
BASOS ABS: 0.1 x10 3/mm (ref 0.0–0.1)
Basophil %: 0.9 %
Eosinophil #: 0.2 x10 3/mm (ref 0.0–0.7)
Eosinophil %: 2.3 %
HCT: 36.1 % (ref 35.0–47.0)
HGB: 11.9 g/dL — ABNORMAL LOW (ref 12.0–16.0)
LYMPHS ABS: 2.3 x10 3/mm (ref 1.0–3.6)
Lymphocyte %: 34.5 %
MCH: 27.3 pg (ref 26.0–34.0)
MCHC: 32.8 g/dL (ref 32.0–36.0)
MCV: 83 fL (ref 80–100)
Monocyte #: 0.6 x10 3/mm (ref 0.2–0.9)
Monocyte %: 9.7 %
Neutrophil #: 3.5 x10 3/mm (ref 1.4–6.5)
Neutrophil %: 52.6 %
PLATELETS: 296 x10 3/mm (ref 150–440)
RBC: 4.35 10*6/uL (ref 3.80–5.20)
RDW: 14.7 % — AB (ref 11.5–14.5)
WBC: 6.6 x10 3/mm (ref 3.6–11.0)

## 2014-06-11 LAB — IRON AND TIBC
IRON BIND. CAP.(TOTAL): 426 (ref 250–450)
IRON: 69 ug/dL
Iron Saturation: 16.2
Unbound Iron-Bind.Cap.: 357.3

## 2014-06-11 LAB — T4, FREE: Free Thyroxine: 0.95 ng/dL

## 2014-06-11 LAB — FOLATE: Folic Acid: 14.6 ng/mL

## 2014-06-11 LAB — FERRITIN: FERRITIN (ARMC): 127 ng/mL

## 2014-06-11 LAB — TSH: Thyroid Stimulating Horm: 0.809 u[IU]/mL

## 2014-11-16 ENCOUNTER — Other Ambulatory Visit: Payer: Self-pay | Admitting: General Surgery

## 2014-11-25 ENCOUNTER — Emergency Department (HOSPITAL_COMMUNITY): Payer: Managed Care, Other (non HMO)

## 2014-11-25 ENCOUNTER — Encounter (HOSPITAL_COMMUNITY): Payer: Self-pay | Admitting: *Deleted

## 2014-11-25 ENCOUNTER — Emergency Department (HOSPITAL_COMMUNITY)
Admission: EM | Admit: 2014-11-25 | Discharge: 2014-11-25 | Disposition: A | Discharge status: Home / Self Care | Payer: Managed Care, Other (non HMO) | Attending: Emergency Medicine | Admitting: Emergency Medicine

## 2014-11-25 DIAGNOSIS — Z880 Allergy status to penicillin: Secondary | ICD-10-CM | POA: Insufficient documentation

## 2014-11-25 DIAGNOSIS — R0602 Shortness of breath: Secondary | ICD-10-CM | POA: Insufficient documentation

## 2014-11-25 DIAGNOSIS — Z720 Tobacco use: Secondary | ICD-10-CM | POA: Diagnosis not present

## 2014-11-25 DIAGNOSIS — K5900 Constipation, unspecified: Secondary | ICD-10-CM | POA: Diagnosis not present

## 2014-11-25 DIAGNOSIS — R109 Unspecified abdominal pain: Secondary | ICD-10-CM | POA: Diagnosis present

## 2014-11-25 DIAGNOSIS — I10 Essential (primary) hypertension: Secondary | ICD-10-CM | POA: Diagnosis not present

## 2014-11-25 DIAGNOSIS — Z853 Personal history of malignant neoplasm of breast: Secondary | ICD-10-CM | POA: Diagnosis not present

## 2014-11-25 DIAGNOSIS — Z79899 Other long term (current) drug therapy: Secondary | ICD-10-CM | POA: Insufficient documentation

## 2014-11-25 DIAGNOSIS — Z8659 Personal history of other mental and behavioral disorders: Secondary | ICD-10-CM | POA: Insufficient documentation

## 2014-11-25 LAB — COMPREHENSIVE METABOLIC PANEL
ALBUMIN: 3.8 g/dL (ref 3.5–5.0)
ALT: 15 U/L (ref 14–54)
AST: 24 U/L (ref 15–41)
Alkaline Phosphatase: 60 U/L (ref 38–126)
Anion gap: 10 (ref 5–15)
BUN: 12 mg/dL (ref 6–20)
CHLORIDE: 107 mmol/L (ref 101–111)
CO2: 22 mmol/L (ref 22–32)
Calcium: 9.2 mg/dL (ref 8.9–10.3)
Creatinine, Ser: 0.81 mg/dL (ref 0.44–1.00)
GFR calc Af Amer: >60 mL/min (ref >60)
GFR calc non Af Amer: >60 mL/min (ref >60)
Glucose, Bld: 137 mg/dL — ABNORMAL HIGH (ref 65–99)
POTASSIUM: 3.1 mmol/L — AB (ref 3.5–5.1)
Sodium: 139 mmol/L (ref 135–145)
Total Bilirubin: 0.2 mg/dL — ABNORMAL LOW (ref 0.3–1.2)
Total Protein: 7.5 g/dL (ref 6.5–8.1)

## 2014-11-25 LAB — URINALYSIS, ROUTINE W REFLEX MICROSCOPIC
Bilirubin Urine: NEGATIVE
Glucose, UA: NEGATIVE mg/dL
Hgb urine dipstick: NEGATIVE
Ketones, ur: NEGATIVE mg/dL
Leukocytes, UA: NEGATIVE
Nitrite: NEGATIVE
PH: 6 (ref 5.0–8.0)
Protein, ur: NEGATIVE mg/dL
SPECIFIC GRAVITY, URINE: 1.009 (ref 1.005–1.030)
Urobilinogen, UA: 0.2 mg/dL (ref 0.0–1.0)

## 2014-11-25 LAB — CBC
HEMATOCRIT: 35.6 % — AB (ref 36.0–46.0)
Hemoglobin: 11.6 g/dL — ABNORMAL LOW (ref 12.0–15.0)
MCH: 27.8 pg (ref 26.0–34.0)
MCHC: 32.6 g/dL (ref 30.0–36.0)
MCV: 85.2 fL (ref 78.0–100.0)
Platelets: 328 10*3/uL (ref 150–400)
RBC: 4.18 MIL/uL (ref 3.87–5.11)
RDW: 14.7 % (ref 11.5–15.5)
WBC: 7.6 10*3/uL (ref 4.0–10.5)

## 2014-11-25 LAB — LIPASE, BLOOD: Lipase: 50 U/L (ref 22–51)

## 2014-11-25 MED ORDER — PSYLLIUM 28 % PO PACK: 1.0000 | PACK | Freq: Two times a day (BID) | ORAL | Status: AC

## 2014-11-25 NOTE — ED Notes (Addendum)
Pt reports last bowel movement this morning at 0645, at 0945 pt started having abd pain, abd distention, and SOB. Pain and distention have resolved. Pain 0/10.Pt reports she is still slight SOB. Pt able to speak in full sentences. Pt reports she had a colonoscopy 3 years ago, was told she had so many polyps she had to come back the following year to have more removed. Pt did not go back for second colonoscopy. Pt hx of breast cancer x2. Restricted right extremity.on 9/23 pt had nodules removed from left axilla area, sebacious cyst, no malignancy suspected. Denies blood in urine or stool. Denies dysuria.

## 2014-11-25 NOTE — ED Provider Notes (Signed)
CSN: [id number]     Arrival date & time 11/25/14  1043 History   First MD Initiated Contact with Patient 11/25/14 1100     Chief Complaint  Patient presents with  . Abdominal Pain  . Shortness of Breath   HPI Patient presents to the emergency room with complaints of abdominal pain. Patient states she was getting ready to go to church when she suddenly started to feel that her abdomen was very bloated and tight. It was very uncomfortable and she felt like it was affecting her breathing because her abdomen was so swollen. This lasted for maybe an hour and since that time her symptoms have completely resolved. She did not take any medications symptoms just resolved on her own. Her abdomen no longer feels tight and swollen. She never had any trouble with nausea or vomiting. She denied any chest pain. She had a normal bowel movement this morning. She denies any trouble with dysuria fever or coughing or other complaints. Past Medical History  Diagnosis Date  . Hypertension   . Breast cancer ([place])     Previous Right breast cancer 2000  . Anxiety     anxiety attacks recent  . GERD (gastroesophageal reflux disease)     hx   history of colonoscopy with multiple polyps. Patient was supposed to go back last year for repeat Past Surgical History  Procedure Laterality Date  . Abdominal hysterectomy    . Breast lumpectomy  2000  . Axillary lymph node dissection  02/02/2012    Procedure: AXILLARY LYMPH NODE DISSECTION;  Surgeon: [name], MD;  Location: [place];  Service: General;  Laterality: Right;  Bilateral mastectomy; left axillary sentinel node biopsy; right axillary lymph node dissection.   . Total mastectomy  02/02/2012    Procedure: TOTAL MASTECTOMY;  Surgeon: [name], MD;  Location: [place];  Service: General;  Laterality: Bilateral;  Bilateral mastectomy; left axillary sentinel node biopsy; right axillary lymph node dissection.   Marland Kitchen Axillary sentinel node biopsy  02/02/2012   Procedure: AXILLARY SENTINEL NODE BIOPSY;  Surgeon: [name], MD;  Location: [place];  Service: General;  Laterality: Left;  Bilateral mastectomy; left axillary sentinel node biopsy; right axillary lymph node dissection.    Family History  Problem Relation Age of Onset  . Lung cancer Paternal Aunt    Social History  Substance Use Topics  . Smoking status: Current Every Day Smoker -- 0.50 packs/day for 39 years    Types: Cigarettes  . Smokeless tobacco: Never Used     Comment: social drinker  . Alcohol Use: Yes   OB History    No data available     Review of Systems  All other systems reviewed and are negative.     Allergies  Penicillins; Aspirin; and Metformin and related  Home Medications   Prior to Admission medications   Medication Sig Start Date End Date Taking? Authorizing Provider  cetirizine (ZYRTEC) 10 MG tablet Take 10 mg by mouth daily.   Yes Historical Provider, MD  lisinopril-hydrochlorothiazide (PRINZIDE,ZESTORETIC) 10-12.5 MG per tablet Take 1 tablet by mouth daily.   Yes Historical Provider, MD  rosuvastatin (CRESTOR) 10 MG tablet Take 10 mg by mouth daily.   Yes Historical Provider, MD  tamoxifen (NOLVADEX) 20 MG tablet TAKE ONE TABLET BY MOUTH ONCE DAILY 09/25/13  Yes Minette Headland, NP  psyllium (METAMUCIL SMOOTH TEXTURE) 28 % packet Take 1 packet by mouth 2 (two) times daily. 11/25/14   Dorie Rank,  MD   BP 122/66 mmHg  Pulse 82  Temp(Src) 98.5 F (36.9 C) (Oral)  Resp 17  SpO2 96% Physical Exam  Constitutional: She appears well-developed and well-nourished. No distress.  HENT:  Head: Normocephalic and atraumatic.  Right Ear: External ear normal.  Left Ear: External ear normal.  Eyes: Conjunctivae are normal. Right eye exhibits no discharge. Left eye exhibits no discharge. No scleral icterus.  Neck: Neck supple. No tracheal deviation present.  Cardiovascular: Normal rate, regular rhythm and intact distal pulses.   Pulmonary/Chest: Effort  normal and breath sounds normal. No stridor. No respiratory distress. She has no wheezes. She has no rales.  Abdominal: Soft. Bowel sounds are normal. She exhibits no distension. There is no tenderness. There is no rebound and no guarding.  Musculoskeletal: She exhibits no edema or tenderness.  Neurological: She is alert. She has normal strength. No cranial nerve deficit (no facial droop, extraocular movements intact, no slurred speech) or sensory deficit. She exhibits normal muscle tone. She displays no seizure activity. Coordination normal.  Skin: Skin is warm and dry. No rash noted.  Psychiatric: She has a normal mood and affect.  Nursing note and vitals reviewed.   ED Course  Procedures (including critical care time) Labs Review Labs Reviewed  COMPREHENSIVE METABOLIC PANEL - Abnormal; Notable for the following:    Potassium 3.1 (*)    Glucose, Bld 137 (*)    Total Bilirubin 0.2 (*)    All other components within normal limits  CBC - Abnormal; Notable for the following:    Hemoglobin 11.6 (*)    HCT 35.6 (*)    All other components within normal limits  LIPASE, BLOOD  URINALYSIS, ROUTINE W REFLEX MICROSCOPIC (NOT AT [facility])    Imaging Review Dg Abd Acute W/chest  11/25/2014   CLINICAL DATA:  Abdominal pain and distention  EXAM: DG ABDOMEN ACUTE W/ 1V CHEST  COMPARISON:  Chest radiograph March 01, 2011  FINDINGS: PA chest: Lungs are clear. Heart size and pulmonary vascularity are normal. No adenopathy. There are surgical clips over the right axillary region.  Supine and upright abdomen: There is moderate stool throughout the colon. There is no bowel dilatation or air-fluid level suggesting obstruction. No free air. There are apparent phleboliths in the pelvis.  IMPRESSION: Moderate stool throughout colon. Bowel gas pattern unremarkable. No lung edema or consolidation.   Electronically Signed   By: Lowella Grip III M.D.   On: 11/25/2014 12:22   I have personally reviewed and  evaluated these images and lab results as part of my medical decision-making.   EKG Interpretation   Date/Time:  Sunday November 25 2014 11:24:48 EDT Ventricular Rate:  78 PR Interval:  161 QRS Duration: 102 QT Interval:  402 QTC Calculation: 458 R Axis:   53 Text Interpretation:  Sinus rhythm No significant change since last  tracing Confirmed by Alonso Gapinski  MD-J, Elazar Argabright ([id number]) on 11/25/2014 11:45:45 AM      MDM   Final diagnoses:  Constipation, unspecified constipation type    Patient's laboratory tests and x-rays were reviewed with the patient. She has a very mild hypoglycemia. She'll increase her dietary potassium intake.  X-rays do not show any evidence of obstruction. She does have moderate stool. Patient admits to having issues with constipation and hard stools. She will increase her fiber intake.  At this time there does not appear to be any evidence of an acute emergency medical condition and the patient appears stable for discharge with appropriate outpatient  follow up.     Dorie Rank, MD 11/25/14 [phone]

## 2014-11-25 NOTE — Discharge Instructions (Signed)
°Bloating °Bloating is the feeling of fullness in your belly. You may feel as though your pants are too tight. Often the cause of bloating is overeating, retaining fluids, or having gas in your bowel. It is also caused by swallowing air and eating foods that cause gas. Irritable bowel syndrome is one of the most common causes of bloating. Constipation is also a common cause. Sometimes more serious problems can cause bloating. °SYMPTOMS  °Usually there is a feeling of fullness, as though your abdomen is bulged out. There may be mild discomfort.  °DIAGNOSIS  °Usually no particular testing is necessary for most bloating. If the condition persists and seems to become worse, your caregiver may do additional testing.  °TREATMENT  °· There is no direct treatment for bloating. °· Do not put gas into the bowel. Avoid chewing gum and sucking on candy. These tend to make you swallow air. Swallowing air can also be a nervous habit. Try to avoid this. °· Avoiding high residue diets will help. Eat foods with soluble fibers (examples include root vegetables, apples, or barley) and substitute dairy products with soy and rice products. This helps irritable bowel syndrome. °· If constipation is the cause, then a high residue diet with more fiber will help. °· Avoid carbonated beverages. °· Over-the-counter preparations are available that help reduce gas. Your pharmacist can help you with this. °SEEK MEDICAL CARE IF:  °· Bloating continues and seems to be getting worse. °· You notice a weight gain. °· You have a weight loss but the bloating is getting worse. °· You have changes in your bowel habits or develop nausea or vomiting. °SEEK IMMEDIATE MEDICAL CARE IF:  °· You develop shortness of breath or swelling in your legs. °· You have an increase in abdominal pain or develop chest pain. °Document Released: 12/10/2005 Document Revised: 05/04/2011 Document Reviewed: 01/28/2007 °Exit[employer]® Patient Information ©2015 [employer]. This  information is not intended to replace advice given to you by your health care provider. Make sure you discuss any questions you have with your health care provider. ° °Constipation °Constipation is when a person: °· Poops (has a bowel movement) less than 3 times a week. °· Has a hard time pooping. °· Has poop that is dry, hard, or bigger than normal. °HOME CARE  °· Eat foods with a lot of fiber in them. This includes fruits, vegetables, beans, and whole grains such as brown rice. °· Avoid fatty foods and foods with a lot of sugar. This includes french fries, hamburgers, cookies, candy, and soda. °· If you are not getting enough fiber from food, take products with added fiber in them (supplements). °· Drink enough fluid to keep your pee (urine) clear or pale yellow. °· Exercise on a regular basis, or as told by your doctor. °· Go to the restroom when you feel like you need to poop. Do not hold it. °· Only take medicine as told by your doctor. Do not take medicines that help you poop (laxatives) without talking to your doctor first. °GET HELP RIGHT AWAY IF:  °· You have bright red blood in your poop (stool). °· Your constipation lasts more than 4 days or gets worse. °· You have belly (abdominal) or butt (rectal) pain. °· You have thin poop (as thin as a pencil). °· You lose weight, and it cannot be explained. °MAKE SURE YOU:  °· Understand these instructions. °· Will watch your condition. °· Will get help right away if you are not doing well or   get worse. °Document Released: 07/29/2007 Document Revised: 02/14/2013 Document Reviewed: 11/21/2012 °Exit[employer]® Patient Information ©2015 [employer]. This information is not intended to replace advice given to you by your health care provider. Make sure you discuss any questions you have with your health care provider. ° ° °

## 2014-12-10 ENCOUNTER — Inpatient Hospital Stay: Payer: Managed Care, Other (non HMO) | Attending: Oncology | Admitting: Oncology

## 2014-12-10 VITALS — BP 111/69 | HR 75 | Temp 96.1°F | Resp 16 | Wt 132.1 lb

## 2014-12-10 DIAGNOSIS — Z79810 Long term (current) use of selective estrogen receptor modulators (SERMs): Secondary | ICD-10-CM | POA: Diagnosis not present

## 2014-12-10 DIAGNOSIS — F17210 Nicotine dependence, cigarettes, uncomplicated: Secondary | ICD-10-CM | POA: Insufficient documentation

## 2014-12-10 DIAGNOSIS — D0512 Intraductal carcinoma in situ of left breast: Secondary | ICD-10-CM | POA: Diagnosis not present

## 2014-12-10 DIAGNOSIS — Z79899 Other long term (current) drug therapy: Secondary | ICD-10-CM | POA: Insufficient documentation

## 2014-12-10 DIAGNOSIS — I10 Essential (primary) hypertension: Secondary | ICD-10-CM | POA: Diagnosis not present

## 2014-12-10 DIAGNOSIS — Z9013 Acquired absence of bilateral breasts and nipples: Secondary | ICD-10-CM | POA: Insufficient documentation

## 2014-12-10 DIAGNOSIS — C50911 Malignant neoplasm of unspecified site of right female breast: Secondary | ICD-10-CM | POA: Insufficient documentation

## 2014-12-10 DIAGNOSIS — Z170 Estrogen receptor positive status [ER+]: Secondary | ICD-10-CM | POA: Insufficient documentation

## 2014-12-10 DIAGNOSIS — F411 Generalized anxiety disorder: Secondary | ICD-10-CM | POA: Insufficient documentation

## 2014-12-10 DIAGNOSIS — K219 Gastro-esophageal reflux disease without esophagitis: Secondary | ICD-10-CM | POA: Insufficient documentation

## 2014-12-10 DIAGNOSIS — C50511 Malignant neoplasm of lower-outer quadrant of right female breast: Secondary | ICD-10-CM

## 2014-12-10 NOTE — Progress Notes (Signed)
Patient had left axillary nodules removed that were diagnosed as sebaceous cysts.

## 2014-12-22 NOTE — Progress Notes (Signed)
[place]  [phone] [phone]) [phone]     ID: Marrion Coy OB: 1955/11/03  MR#: [id number]  IRJ#:[id number]  Patient Care Team: Lucianne Lei, MD as PCP - General (Family Medicine)  CHIEF COMPLAINT:  Chief Complaint  Patient presents with  . Breast Cancer    BREAST CANCER HISTORY:  #1patient was originally seen in the multidisciplinary breast clinic at which time she was found to have a right-sided breast cancer T1 C. N0. 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 Because of this she went on to have bilateral mastectomies with axillary lymph node biopsies.  #2 her final pathology reveals right invasive ductal carcinoma, grade one measuring 1.4 cm ER positive nodes were negative.ER positive PR positive HER-2/neu negative with Ki-67 of 5%.  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  #4 patient is status post bilateral mastectomies on 02/01/2013 with the final pathology revealing on the right side 1.4 cm invasive ductal carcinoma grade 1 ER positive PR positive HER-2/neu negative Ki-67 5%. Sentinel node was negative for metastatic disease. She is also status post simple mastectomy on the left breast at the final pathology revealing a DCIS.  #5 patient will now begin antiestrogen therapy with tamoxifen 20 mg daily starting 03/24/2012. Risks and benefits of tamoxifen were discussed with the patient. A total of 5-10 years of therapy is planned.     INTERVAL HISTORY:  Patient returns to clinic today for routine follow-up. She continues to tolerate tamoxifen without significant side effects.  She denies any recent fevers or illnesses. She has a good appetite and denies weight loss.  She continues to be active and work full-time.  She has no neurologic complaints.  She denies any chest pain or shortness of breath. She has no nausea,  vomiting, constipation, or diarrhea.  She denies any urinary complaints.  Patient feels at her baseline and offers no further specific complaints today.  REVIEW OF SYSTEMS:  As per HPI. Otherwise, 10 point system review was negative.  PAST MEDICAL HISTORY: Past Medical History  Diagnosis Date  . Hypertension   . Breast cancer ([place])     Previous Right breast cancer 2000  . Anxiety     anxiety attacks recent  . GERD (gastroesophageal reflux disease)     hx    PAST SURGICAL HISTORY: Past Surgical History  Procedure Laterality Date  . Abdominal hysterectomy    . Breast lumpectomy  2000  . Axillary lymph node dissection  02/02/2012    Procedure: AXILLARY LYMPH NODE DISSECTION;  Surgeon: [name], MD;  Location: [facility];  Service: General;  Laterality: Right;  Bilateral mastectomy; left axillary sentinel node biopsy; right axillary lymph node dissection.   . Total mastectomy  02/02/2012    Procedure: TOTAL MASTECTOMY;  Surgeon: [name], MD;  Location: [facility];  Service: General;  Laterality: Bilateral;  Bilateral mastectomy; left axillary sentinel node biopsy; right axillary lymph node dissection.   Marland Kitchen Axillary sentinel node biopsy  02/02/2012    Procedure: AXILLARY SENTINEL NODE BIOPSY;  Surgeon: [name], MD;  Location: [facility];  Service: General;  Laterality: Left;  Bilateral mastectomy; left axillary sentinel node biopsy; right axillary lymph node dissection.     FAMILY HISTORY Family History  Problem Relation Age of Onset  . Lung cancer Paternal Aunt  GYNECOLOGIC HISTORY:  No LMP recorded. Patient has had a hysterectomy.      ADVANCED DIRECTIVES:    HEALTH MAINTENANCE: Social History  Substance Use Topics  . Smoking status: Current Every Day Smoker -- 0.50 packs/day for 39 years    Types: Cigarettes  . Smokeless tobacco: Never Used     Comment: social drinker  . Alcohol Use: Yes     Colonoscopy:  PAP:  Bone density:  Lipid  panel:  Allergies  Allergen Reactions  . Penicillins Anaphylaxis, Hives, Itching and Other (See Comments)    Whelps, itching, hives, throat closing Did PCN reaction causing immediate rash, facial/tongue/throat swelling, SOB or lightheadedness with hypotension: Yes Did PCN reaction causing severe rash involving mucus membranes or skin necrosis: Yes Has patient had a PCN reaction that required hospitalization Yes- went to urgent care Has patient had a PCN reaction occurring within the last 10 years: No- happened in the 90's If all of the above answers are "NO", then may proceed with Cephalosporin use.   . Aspirin Nausea And Vomiting and Other (See Comments)    Upset stomach  . Metformin And Related Diarrhea    Tired, stomach was jumping flips, making me feel bad    Current Outpatient Prescriptions  Medication Sig Dispense Refill  . cetirizine (ZYRTEC) 10 MG tablet Take 10 mg by mouth daily.    Marland Kitchen lisinopril-hydrochlorothiazide (PRINZIDE,ZESTORETIC) 10-12.5 MG per tablet Take 1 tablet by mouth daily.    . psyllium (METAMUCIL SMOOTH TEXTURE) 28 % packet Take 1 packet by mouth 2 (two) times daily. 30 packet 1  . rosuvastatin (CRESTOR) 10 MG tablet Take 10 mg by mouth daily.    . tamoxifen (NOLVADEX) 20 MG tablet TAKE ONE TABLET BY MOUTH ONCE DAILY 90 tablet 0   No current facility-administered medications for this visit.    OBJECTIVE: Filed Vitals:   12/10/14 1122  BP: 111/69  Pulse: 75  Temp: 96.1 F (35.6 C)  Resp: 16     Body mass index is 26.24 kg/(m^2).    ECOG FS:0 - Asymptomatic  General: Well-developed, well-nourished, no acute distress. Eyes: Pink conjunctiva, anicteric sclera. Breasts: Bilateral mastectomy.  Bilateral chest wall without evidence of recurrence. Lungs: Clear to auscultation bilaterally. Heart: Regular rate and rhythm. No rubs, murmurs, or gallops. Abdomen: Soft, nontender, nondistended. No organomegaly noted, normoactive bowel sounds. Musculoskeletal:  No edema, cyanosis, or clubbing. Neuro: Alert, answering all questions appropriately. Cranial nerves grossly intact. Skin: No rashes or petechiae noted. Psych: Normal affect.   LAB RESULTS:  CMP     Component Value Date/Time   NA 139 11/25/2014 1120   NA 144 08/02/2013 0951   K 3.1* 11/25/2014 1120   K 3.6 08/02/2013 0951   CL 107 11/25/2014 1120   CL 106 06/28/2012 1433   CO2 22 11/25/2014 1120   CO2 27 08/02/2013 0951   GLUCOSE 137* 11/25/2014 1120   GLUCOSE 95 08/02/2013 0951   GLUCOSE 119* 06/28/2012 1433   BUN 12 11/25/2014 1120   BUN 10.5 08/02/2013 0951   CREATININE 0.81 11/25/2014 1120   CREATININE 0.9 08/02/2013 0951   CALCIUM 9.2 11/25/2014 1120   CALCIUM 9.3 08/02/2013 0951   PROT 7.5 11/25/2014 1120   PROT 7.1 08/02/2013 0951   ALBUMIN 3.8 11/25/2014 1120   ALBUMIN 3.3* 08/02/2013 0951   AST 24 11/25/2014 1120   AST 16 08/02/2013 0951   ALT 15 11/25/2014 1120   ALT 12 08/02/2013 0951   ALKPHOS 60 11/25/2014 1120  ALKPHOS 57 08/02/2013 0951   BILITOT 0.2* 11/25/2014 1120   BILITOT 0.21 08/02/2013 0951   GFRNONAA >60 11/25/2014 1120   GFRAA >60 11/25/2014 1120    I No results found for: SPEP  Lab Results  Component Value Date   WBC 7.6 11/25/2014   NEUTROABS 3.5 06/11/2014   HGB 11.6* 11/25/2014   HCT 35.6* 11/25/2014   MCV 85.2 11/25/2014   PLT 328 11/25/2014      Chemistry      Component Value Date/Time   NA 139 11/25/2014 1120   NA 144 08/02/2013 0951   K 3.1* 11/25/2014 1120   K 3.6 08/02/2013 0951   CL 107 11/25/2014 1120   CL 106 06/28/2012 1433   CO2 22 11/25/2014 1120   CO2 27 08/02/2013 0951   BUN 12 11/25/2014 1120   BUN 10.5 08/02/2013 0951   CREATININE 0.81 11/25/2014 1120   CREATININE 0.9 08/02/2013 0951      Component Value Date/Time   CALCIUM 9.2 11/25/2014 1120   CALCIUM 9.3 08/02/2013 0951   ALKPHOS 60 11/25/2014 1120   ALKPHOS 57 08/02/2013 0951   AST 24 11/25/2014 1120   AST 16 08/02/2013 0951   ALT 15  11/25/2014 1120   ALT 12 08/02/2013 0951   BILITOT 0.2* 11/25/2014 1120   BILITOT 0.21 08/02/2013 0951       Lab Results  Component Value Date   LABCA2 26 01/06/2012    No components found for: [id number]  No results for input(s): INR in the last 168 hours.  Urinalysis    Component Value Date/Time   COLORURINE YELLOW 11/25/2014 1110    STUDIES: No results found.  ASSESSMENT:  Bilateral breast cancer: Right breast with DCIS and left breast invasive ductal carcinoma that is ER positive HER-2/neu negative. Patient is now status post bilateral mastectomies.   PLAN:   1.  Breast cancer: No evidence of recurrence.  Continue to tamoxifen for a total of 5 years completing in January of 2019.  Because of her bilateral mastectomy, patient no longer requires mammograms.  Patient will return to clinic in 6 months for routine followup. She understands that she can call clinic at any time if she has any questions, concerns, or complaints.   Lloyd Huger, MD   12/22/2014 8:13 PM

## 2015-02-28 ENCOUNTER — Encounter: Payer: Self-pay | Admitting: General Surgery

## 2015-02-28 NOTE — Progress Notes (Signed)
Kerri Carpenter 02/28/2015 4:05 PM Location: [facility] Patient #: [phone] DOB: [birth year] Married / Language: English / Race: Black or African American Female   History of Present Illness Kerri Hollingshead MD; 02/28/2015 4:20 PM) The patient is a 60 year old female.  Note:Procedure: Bilateral mastectomies and axillary lymph nodes biopsies.  Date: 02/02/12  Pathology: Right breast is T1cN0, Left breast is Tis.  History: She is here for followup of her bilateral breast cancers. Pathology of the subcutaneous nodule removed back in September 2016 was a sebaceous cyst. She denies any new chest wall nodules. Occasionally has some mild right arm swelling consistent with lymphedema that responds well to the lymphedema sleeve. She is taking tamoxifen. She is being followed by Dr. Grayland Ormond. She has a new primary care physician, [name].  Allergies Kerri Carpenter, CMA; 02/28/2015 4:18 PM) Aspirin *ANALGESICS - NonNarcotic* Penicillins  Medication History Kerri Carpenter, CMA; 02/28/2015 4:18 PM) ZyrTEC Allergy (10MG  Capsule, Oral) Active. Lisinopril-Hydrochlorothiazide (10-12.5MG  Tablet, Oral) Active. Crestor (10MG  Tablet, Oral) Active. Nolvadex (20MG  Tablet, Oral) Active. Psyllium (28% Packet, Oral daily) Active. Medications Reconciled  Vitals Kerri Carpenter CMA; 02/28/2015 4:17 PM) 02/28/2015 4:17 PM Weight: 136.2 lb Height: 59.5in Body Surface Area: 1.58 m Body Mass Index: 27.05 kg/m  Temp.: 97.3F(Oral)  Pulse: 88 (Regular)  BP: 116/68 (Sitting, Left Arm, Standard)       Physical Exam Kerri Hollingshead MD; 02/28/2015 4:21 PM) The physical exam findings are as follows: Note:General-she looks well and is distress.  Chest-bilateral transverse scars noted with no chest wall nodules palpable.  Lymph nodes-no palpable supraclavicular or axillary adenopathy.    Assessment & Plan Kerri Hollingshead MD; 02/28/2015 4:22 PM) BILATERAL  MALIGNANT NEOPLASM OF BREAST IN FEMALE, UNSPECIFIED SITE OF BREAST (C50.911) Impression: Doing well. No clinical signs of recurrence.  Plan: Continue follow-ups with Dr. Grayland Ormond. Return visit here prn.  Kerri Confer, MD

## 2015-06-10 ENCOUNTER — Inpatient Hospital Stay: Payer: Managed Care, Other (non HMO)

## 2015-06-10 ENCOUNTER — Inpatient Hospital Stay: Payer: Managed Care, Other (non HMO) | Attending: Oncology | Admitting: Oncology

## 2015-06-10 VITALS — BP 104/66 | HR 96 | Temp 98.1°F | Resp 16 | Wt 127.2 lb

## 2015-06-10 DIAGNOSIS — Z79899 Other long term (current) drug therapy: Secondary | ICD-10-CM | POA: Insufficient documentation

## 2015-06-10 DIAGNOSIS — D509 Iron deficiency anemia, unspecified: Secondary | ICD-10-CM

## 2015-06-10 DIAGNOSIS — R0789 Other chest pain: Secondary | ICD-10-CM | POA: Diagnosis not present

## 2015-06-10 DIAGNOSIS — C50511 Malignant neoplasm of lower-outer quadrant of right female breast: Secondary | ICD-10-CM

## 2015-06-10 DIAGNOSIS — C50912 Malignant neoplasm of unspecified site of left female breast: Secondary | ICD-10-CM | POA: Insufficient documentation

## 2015-06-10 DIAGNOSIS — D0511 Intraductal carcinoma in situ of right breast: Secondary | ICD-10-CM | POA: Diagnosis present

## 2015-06-10 DIAGNOSIS — Z9013 Acquired absence of bilateral breasts and nipples: Secondary | ICD-10-CM | POA: Diagnosis not present

## 2015-06-10 DIAGNOSIS — R918 Other nonspecific abnormal finding of lung field: Secondary | ICD-10-CM | POA: Insufficient documentation

## 2015-06-10 DIAGNOSIS — D0512 Intraductal carcinoma in situ of left breast: Secondary | ICD-10-CM

## 2015-06-10 DIAGNOSIS — Z170 Estrogen receptor positive status [ER+]: Secondary | ICD-10-CM | POA: Diagnosis not present

## 2015-06-10 DIAGNOSIS — F17210 Nicotine dependence, cigarettes, uncomplicated: Secondary | ICD-10-CM

## 2015-06-10 DIAGNOSIS — K219 Gastro-esophageal reflux disease without esophagitis: Secondary | ICD-10-CM | POA: Diagnosis not present

## 2015-06-10 DIAGNOSIS — M79621 Pain in right upper arm: Secondary | ICD-10-CM | POA: Insufficient documentation

## 2015-06-10 DIAGNOSIS — C50519 Malignant neoplasm of lower-outer quadrant of unspecified female breast: Secondary | ICD-10-CM

## 2015-06-10 DIAGNOSIS — I10 Essential (primary) hypertension: Secondary | ICD-10-CM | POA: Insufficient documentation

## 2015-06-10 DIAGNOSIS — F419 Anxiety disorder, unspecified: Secondary | ICD-10-CM | POA: Insufficient documentation

## 2015-06-10 LAB — CBC WITH DIFFERENTIAL/PLATELET
BASOS ABS: 0.1 10*3/uL (ref 0–0.1)
Basophils Relative: 1 %
EOS PCT: 0 %
Eosinophils Absolute: 0 10*3/uL (ref 0–0.7)
HCT: 34.5 % — ABNORMAL LOW (ref 35.0–47.0)
Hemoglobin: 11.6 g/dL — ABNORMAL LOW (ref 12.0–16.0)
Lymphocytes Relative: 23 %
Lymphs Abs: 1.8 10*3/uL (ref 1.0–3.6)
MCH: 26.4 pg (ref 26.0–34.0)
MCHC: 33.5 g/dL (ref 32.0–36.0)
MCV: 78.8 fL — ABNORMAL LOW (ref 80.0–100.0)
MONO ABS: 0.8 10*3/uL (ref 0.2–0.9)
Monocytes Relative: 10 %
NEUTROS ABS: 5.1 10*3/uL (ref 1.4–6.5)
Neutrophils Relative %: 66 %
PLATELETS: 471 10*3/uL — AB (ref 150–440)
RBC: 4.38 MIL/uL (ref 3.80–5.20)
RDW: 14 % (ref 11.5–14.5)
WBC: 7.8 10*3/uL (ref 3.6–11.0)

## 2015-06-10 LAB — COMPREHENSIVE METABOLIC PANEL
ALK PHOS: 92 U/L (ref 38–126)
ALT: 28 U/L (ref 14–54)
AST: 30 U/L (ref 15–41)
Albumin: 3.4 g/dL — ABNORMAL LOW (ref 3.5–5.0)
Anion gap: 6 (ref 5–15)
BILIRUBIN TOTAL: 0.3 mg/dL (ref 0.3–1.2)
BUN: 11 mg/dL (ref 6–20)
CALCIUM: 8.4 mg/dL — AB (ref 8.9–10.3)
CO2: 27 mmol/L (ref 22–32)
CREATININE: 0.88 mg/dL (ref 0.44–1.00)
Chloride: 100 mmol/L — ABNORMAL LOW (ref 101–111)
GFR calc Af Amer: >60 mL/min (ref >60)
GFR calc non Af Amer: >60 mL/min (ref >60)
Glucose, Bld: 121 mg/dL — ABNORMAL HIGH (ref 65–99)
Potassium: 3.8 mmol/L (ref 3.5–5.1)
Sodium: 133 mmol/L — ABNORMAL LOW (ref 135–145)
TOTAL PROTEIN: 8 g/dL (ref 6.5–8.1)

## 2015-06-10 LAB — IRON AND TIBC
Iron: 19 ug/dL — ABNORMAL LOW (ref 28–170)
SATURATION RATIOS: 6 % — AB (ref 10.4–31.8)
TIBC: 326 ug/dL (ref 250–450)
UIBC: 308 ug/dL

## 2015-06-10 LAB — FERRITIN: Ferritin: 241 ng/mL (ref 11–307)

## 2015-06-10 MED ORDER — TAMOXIFEN CITRATE 20 MG PO TABS: ORAL_TABLET | ORAL | Status: DC

## 2015-06-10 NOTE — Progress Notes (Signed)
Patient is here for 6 month f/u and would like to discuss right side chest pain that radiates to her back and down her arm for the past 2 months.  She was evaluated by her PCP on 05/29/15 who diagnosed as neuropathy and was prescribed Gabapentin 600mg  BID but has only gotten minimal relief.  Also has concerns of extreme exhaustion for months.

## 2015-06-10 NOTE — Progress Notes (Signed)
[place]  [phone] Fax:(336) [phone]     ID: Kerri Carpenter OB: 02-28-1955  MR#: [id number]  PVV#:[id number]  Patient Care Team: Velna Hatchet, MD as PCP - General (Internal Medicine)  CHIEF COMPLAINT:  Chief Complaint  Patient presents with  . Breast Cancer    BREAST CANCER HISTORY:  #1patient was originally seen in the multidisciplinary breast clinic at which time she was found to have a right-sided breast cancer T1 C. N0. 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 Because of this she went on to have bilateral mastectomies with axillary lymph node biopsies.  #2 her final pathology reveals right invasive ductal carcinoma, grade one measuring 1.4 cm ER positive nodes were negative.ER positive PR positive HER-2/neu negative with Ki-67 of 5%.  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  #4 patient is status post bilateral mastectomies on 02/01/2013 with the final pathology revealing on the right side 1.4 cm invasive ductal carcinoma grade 1 ER positive PR positive HER-2/neu negative Ki-67 5%. Sentinel node was negative for metastatic disease. She is also status post simple mastectomy on the left breast at the final pathology revealing a DCIS.  #5 patient will now begin antiestrogen therapy with tamoxifen 20 mg daily starting 03/24/2012. Risks and benefits of tamoxifen were discussed with the patient. A total of 5-10 years of therapy is planned.     INTERVAL HISTORY: Patient returns to clinic today for routine follow-up. She continues to tolerate tamoxifen without significant side effects.  She has complaints of right side arm pain that is from her elbow to her right chest wall that started in February. She tried tylenol, hot packs and aspercreme for the month of February without releif. She had an appointment with her pcp, Dr  Velna Hatchet, on April 5th and he gave her gabapentin which gives her minimal relief in her arm but her chest wall still has pain. Her right side also seems 'more full' than the left side and this concerns her. She has not noticed any lumps. She also has complaints of 'extreme exhaustion". She goes to bed as soon as she gets home from work around 5:30 and she has no energy to do anything. She falls asleep at lunch at work and has a hard time completing her duties at work. This has been getting worse over the past 6 months. She denies any chest pain or shortness of breath. She has no nausea, vomiting, constipation, or diarrhea.  She denies any urinary complaints.    REVIEW OF SYSTEMS:  As per HPI. Otherwise, 10 point system review was negative.  PAST MEDICAL HISTORY: Past Medical History  Diagnosis Date  . Hypertension   . Breast cancer ([place])     Previous Right breast cancer 2000  . Anxiety     anxiety attacks recent  . GERD (gastroesophageal reflux disease)     hx    PAST SURGICAL HISTORY: Past Surgical History  Procedure Laterality Date  . Abdominal hysterectomy    . Breast lumpectomy  2000  . Axillary lymph node dissection  02/02/2012    Procedure: AXILLARY LYMPH NODE DISSECTION;  Surgeon: [name], MD;  Location: [facility];  Service: General;  Laterality: Right;  Bilateral mastectomy; left axillary sentinel node biopsy; right axillary lymph node dissection.   . Total mastectomy  02/02/2012  Procedure: TOTAL MASTECTOMY;  Surgeon: [name], MD;  Location: [facility];  Service: General;  Laterality: Bilateral;  Bilateral mastectomy; left axillary sentinel node biopsy; right axillary lymph node dissection.   Marland Kitchen Axillary sentinel node biopsy  02/02/2012    Procedure: AXILLARY SENTINEL NODE BIOPSY;  Surgeon: [name], MD;  Location: [facility];  Service: General;  Laterality: Left;  Bilateral mastectomy; left axillary sentinel node biopsy; right axillary lymph node dissection.      FAMILY HISTORY Family History  Problem Relation Age of Onset  . Lung cancer Paternal Aunt     GYNECOLOGIC HISTORY:  No LMP recorded. Patient has had a hysterectomy.      ADVANCED DIRECTIVES:    HEALTH MAINTENANCE: Social History  Substance Use Topics  . Smoking status: Current Every Day Smoker -- 0.50 packs/day for 39 years    Types: Cigarettes  . Smokeless tobacco: Never Used     Comment: social drinker  . Alcohol Use: Yes     Colonoscopy:  PAP:  Bone density:  Lipid panel:  Allergies  Allergen Reactions  . Penicillins Anaphylaxis, Hives, Itching and Other (See Comments)    Whelps, itching, hives, throat closing Did PCN reaction causing immediate rash, facial/tongue/throat swelling, SOB or lightheadedness with hypotension: Yes Did PCN reaction causing severe rash involving mucus membranes or skin necrosis: Yes Has patient had a PCN reaction that required hospitalization Yes- went to urgent care Has patient had a PCN reaction occurring within the last 10 years: No- happened in the 90's If all of the above answers are "NO", then may proceed with Cephalosporin use.   . Aspirin Nausea And Vomiting and Other (See Comments)    Upset stomach  . Metformin And Related Diarrhea    Tired, stomach was jumping flips, making me feel bad    Current Outpatient Prescriptions  Medication Sig Dispense Refill  . cetirizine (ZYRTEC) 10 MG tablet Take 10 mg by mouth daily.    Marland Kitchen gabapentin (NEURONTIN) 600 MG tablet Take 600 mg by mouth 2 (two) times daily.    Marland Kitchen lisinopril-hydrochlorothiazide (PRINZIDE,ZESTORETIC) 10-12.5 MG per tablet Take 1 tablet by mouth daily.    . psyllium (METAMUCIL SMOOTH TEXTURE) 28 % packet Take 1 packet by mouth 2 (two) times daily. 30 packet 1  . rosuvastatin (CRESTOR) 10 MG tablet Take 10 mg by mouth daily.    . tamoxifen (NOLVADEX) 20 MG tablet TAKE ONE TABLET BY MOUTH ONCE DAILY 90 tablet 1   No current facility-administered medications for this  visit.    OBJECTIVE: Filed Vitals:   06/10/15 0951  BP: 104/66  Pulse: 96  Temp: 98.1 F (36.7 C)  Resp: 16     Body mass index is 25.27 kg/(m^2).    ECOG FS:0 - Asymptomatic  General: Well-developed, well-nourished, no acute distress. Eyes: Pink conjunctiva, anicteric sclera. Breasts: Bilateral mastectomy.  Bilateral chest wall without evidence of recurrence. Lungs: Clear to auscultation bilaterally. Heart: Regular rate and rhythm. No rubs, murmurs, or gallops. Abdomen: Soft, nontender, nondistended. No organomegaly noted, normoactive bowel sounds. Musculoskeletal: No edema, cyanosis, or clubbing. Neuro: Alert, answering all questions appropriately. Cranial nerves grossly intact. Skin: No rashes or petechiae noted. Psych: Normal affect.   LAB RESULTS:  CMP     Component Value Date/Time   NA 139 11/25/2014 1120   NA 144 08/02/2013 0951   K 3.1* 11/25/2014 1120   K 3.6 08/02/2013 0951   CL 107 11/25/2014 1120   CL 106 06/28/2012 1433  CO2 22 11/25/2014 1120   CO2 27 08/02/2013 0951   GLUCOSE 137* 11/25/2014 1120   GLUCOSE 95 08/02/2013 0951   GLUCOSE 119* 06/28/2012 1433   BUN 12 11/25/2014 1120   BUN 10.5 08/02/2013 0951   CREATININE 0.81 11/25/2014 1120   CREATININE 0.9 08/02/2013 0951   CALCIUM 9.2 11/25/2014 1120   CALCIUM 9.3 08/02/2013 0951   PROT 7.5 11/25/2014 1120   PROT 7.1 08/02/2013 0951   ALBUMIN 3.8 11/25/2014 1120   ALBUMIN 3.3* 08/02/2013 0951   AST 24 11/25/2014 1120   AST 16 08/02/2013 0951   ALT 15 11/25/2014 1120   ALT 12 08/02/2013 0951   ALKPHOS 60 11/25/2014 1120   ALKPHOS 57 08/02/2013 0951   BILITOT 0.2* 11/25/2014 1120   BILITOT 0.21 08/02/2013 0951   GFRNONAA >60 11/25/2014 1120   GFRAA >60 11/25/2014 1120    I No results found for: SPEP  Lab Results  Component Value Date   WBC 7.6 11/25/2014   NEUTROABS 3.5 06/11/2014   HGB 11.6* 11/25/2014   HCT 35.6* 11/25/2014   MCV 85.2 11/25/2014   PLT 328 11/25/2014       Chemistry      Component Value Date/Time   NA 139 11/25/2014 1120   NA 144 08/02/2013 0951   K 3.1* 11/25/2014 1120   K 3.6 08/02/2013 0951   CL 107 11/25/2014 1120   CL 106 06/28/2012 1433   CO2 22 11/25/2014 1120   CO2 27 08/02/2013 0951   BUN 12 11/25/2014 1120   BUN 10.5 08/02/2013 0951   CREATININE 0.81 11/25/2014 1120   CREATININE 0.9 08/02/2013 0951      Component Value Date/Time   CALCIUM 9.2 11/25/2014 1120   CALCIUM 9.3 08/02/2013 0951   ALKPHOS 60 11/25/2014 1120   ALKPHOS 57 08/02/2013 0951   AST 24 11/25/2014 1120   AST 16 08/02/2013 0951   ALT 15 11/25/2014 1120   ALT 12 08/02/2013 0951   BILITOT 0.2* 11/25/2014 1120   BILITOT 0.21 08/02/2013 0951       Lab Results  Component Value Date   LABCA2 26 01/06/2012    No components found for: [id number]  No results for input(s): INR in the last 168 hours.  Urinalysis    Component Value Date/Time   COLORURINE YELLOW 11/25/2014 1110    STUDIES: No results found.  ASSESSMENT:  Bilateral breast cancer: Right breast with DCIS and left breast invasive ductal carcinoma that is ER positive HER-2/neu negative. Patient is now status post bilateral mastectomies.   PLAN:   1.  Breast cancer: No evidence of recurrence.  Continue to tamoxifen for a total of 5 years completing in January of 2019.  Because of her bilateral mastectomy, patient no longer requires mammograms.  Patient will return to clinic in 6 months for routine followup. Tamoxifen refill was sent to pharmacy today. She understands that she can call clinic at any time if she has any questions, concerns, or complaints. 2. Fatigue: Labs today to include cbc, cmp, iron studies and ferritin. Call patient with results. 3. Chest wall pain: CT scan ordered. Call patient with results.    Mayra Reel, NP   06/10/2015 10:27 AM  Patient seen and evaluated independently and I agree with the assessment and plan as dictated above.  Lloyd Huger, MD  06/10/2015 10:45 AM

## 2015-06-11 ENCOUNTER — Other Ambulatory Visit: Payer: Self-pay | Admitting: Oncology

## 2015-06-18 ENCOUNTER — Inpatient Hospital Stay: Payer: Managed Care, Other (non HMO)

## 2015-06-18 VITALS — BP 106/64 | HR 88 | Resp 20

## 2015-06-18 DIAGNOSIS — D0511 Intraductal carcinoma in situ of right breast: Secondary | ICD-10-CM | POA: Diagnosis not present

## 2015-06-18 DIAGNOSIS — D509 Iron deficiency anemia, unspecified: Secondary | ICD-10-CM

## 2015-06-18 MED ORDER — SODIUM CHLORIDE 0.9 % IV SOLN
510.0000 mg | Freq: Once | INTRAVENOUS | Status: AC
  Administered 2015-06-18: 510 mg via INTRAVENOUS
  Filled 2015-06-18: qty 17

## 2015-06-18 MED ORDER — SODIUM CHLORIDE 0.9 % IV SOLN
Freq: Once | INTRAVENOUS | Status: AC
  Administered 2015-06-18: 15:00:00 via INTRAVENOUS
  Filled 2015-06-18: qty 1000

## 2015-06-19 ENCOUNTER — Ambulatory Visit
Admission: RE | Admit: 2015-06-19 | Discharge: 2015-06-19 | Disposition: A | Discharge status: Home / Self Care | Payer: Managed Care, Other (non HMO) | Source: Ambulatory Visit | Attending: Oncology | Admitting: Oncology

## 2015-06-19 DIAGNOSIS — J9859 Other diseases of mediastinum, not elsewhere classified: Secondary | ICD-10-CM | POA: Insufficient documentation

## 2015-06-19 DIAGNOSIS — E279 Disorder of adrenal gland, unspecified: Secondary | ICD-10-CM | POA: Insufficient documentation

## 2015-06-19 DIAGNOSIS — C50511 Malignant neoplasm of lower-outer quadrant of right female breast: Secondary | ICD-10-CM

## 2015-06-19 DIAGNOSIS — R590 Localized enlarged lymph nodes: Secondary | ICD-10-CM | POA: Insufficient documentation

## 2015-06-19 DIAGNOSIS — R918 Other nonspecific abnormal finding of lung field: Secondary | ICD-10-CM | POA: Diagnosis not present

## 2015-06-19 HISTORY — DX: Malignant neoplasm of unspecified site of unspecified female breast: C50.919

## 2015-06-19 MED ORDER — IOPAMIDOL (ISOVUE-300) INJECTION 61%
75.0000 mL | Freq: Once | INTRAVENOUS | Status: AC | PRN
  Administered 2015-06-19: 75 mL via INTRAVENOUS

## 2015-06-21 ENCOUNTER — Inpatient Hospital Stay: Payer: Managed Care, Other (non HMO)

## 2015-06-21 ENCOUNTER — Inpatient Hospital Stay (HOSPITAL_BASED_OUTPATIENT_CLINIC_OR_DEPARTMENT_OTHER): Payer: Managed Care, Other (non HMO) | Admitting: Oncology

## 2015-06-21 VITALS — BP 102/67 | HR 94 | Temp 97.0°F | Resp 18

## 2015-06-21 DIAGNOSIS — C50511 Malignant neoplasm of lower-outer quadrant of right female breast: Secondary | ICD-10-CM

## 2015-06-21 DIAGNOSIS — M79621 Pain in right upper arm: Secondary | ICD-10-CM

## 2015-06-21 DIAGNOSIS — R918 Other nonspecific abnormal finding of lung field: Secondary | ICD-10-CM | POA: Diagnosis not present

## 2015-06-21 DIAGNOSIS — D0511 Intraductal carcinoma in situ of right breast: Secondary | ICD-10-CM | POA: Diagnosis not present

## 2015-06-21 DIAGNOSIS — Z9013 Acquired absence of bilateral breasts and nipples: Secondary | ICD-10-CM

## 2015-06-21 DIAGNOSIS — C50912 Malignant neoplasm of unspecified site of left female breast: Secondary | ICD-10-CM

## 2015-06-21 DIAGNOSIS — Z79899 Other long term (current) drug therapy: Secondary | ICD-10-CM

## 2015-06-21 DIAGNOSIS — R0789 Other chest pain: Secondary | ICD-10-CM

## 2015-06-21 DIAGNOSIS — F17210 Nicotine dependence, cigarettes, uncomplicated: Secondary | ICD-10-CM

## 2015-06-21 MED ORDER — HYDROCODONE-ACETAMINOPHEN 5-325 MG PO TABS: 1.0000 | ORAL_TABLET | Freq: Four times a day (QID) | ORAL | Status: DC | PRN

## 2015-06-21 NOTE — Progress Notes (Signed)
Patient has episodes of right side chest pain that is 6/10 on pain scale.  Also having right axillary discomfort that she explains as "feeling full".  Has loss of appetite with nausea and not able to look at food without feeling sick.  Does have a lot of fatigue that did slightly improve with iron infusion this week.

## 2015-06-21 NOTE — Progress Notes (Signed)
[facility]  [phone] [phone]) [phone]     ID: Marrion Coy OB: 12/27/1955  MR#: [id number]  IRW#:[id number]  Patient Care Team: Velna Hatchet, MD as PCP - General (Internal Medicine)  CHIEF COMPLAINT:  Chief Complaint  Patient presents with  . Results    INTERVAL HISTORY: Patient returns to clinic today for Further evaluation and discussion of her CT scan results. She continues to have significant right-sided chest and arm pain that typically keeps her awake at night. She also has a poor appetite and is occasionally nauseous. She continues to have significant weakness and fatigue. She has no neurologic complaints. She denies any other pain. She denies any vomiting, constipation, or diarrhea. She has no urinary complaints. Patient otherwise feels well and offers no further specific complaints.    REVIEW OF SYSTEMS:  As per HPI. Otherwise, 10 point system review was negative.  PAST MEDICAL HISTORY: Past Medical History  Diagnosis Date  . Hypertension   . Anxiety     anxiety attacks recent  . GERD (gastroesophageal reflux disease)     hx  . Breast cancer ([place]) 2000    Previous Right breast cancer 2000, Lumpectomy, rad tx  . Recurrent breast cancer ([place]) 2013    Bilateral Mastectomies    PAST SURGICAL HISTORY: Past Surgical History  Procedure Laterality Date  . Abdominal hysterectomy    . Breast lumpectomy  2000  . Axillary lymph node dissection  02/02/2012    Procedure: AXILLARY LYMPH NODE DISSECTION;  Surgeon: [name], MD;  Location: [facility];  Service: General;  Laterality: Right;  Bilateral mastectomy; left axillary sentinel node biopsy; right axillary lymph node dissection.   . Total mastectomy  02/02/2012    Procedure: TOTAL MASTECTOMY;  Surgeon: [name], MD;  Location: [facility];  Service: General;  Laterality: Bilateral;  Bilateral mastectomy; left axillary sentinel node biopsy; right axillary lymph node dissection.   Marland Kitchen  Axillary sentinel node biopsy  02/02/2012    Procedure: AXILLARY SENTINEL NODE BIOPSY;  Surgeon: [name], MD;  Location: [facility];  Service: General;  Laterality: Left;  Bilateral mastectomy; left axillary sentinel node biopsy; right axillary lymph node dissection.     FAMILY HISTORY Family History  Problem Relation Age of Onset  . Lung cancer Paternal Aunt     GYNECOLOGIC HISTORY:  No LMP recorded. Patient has had a hysterectomy.      ADVANCED DIRECTIVES:    HEALTH MAINTENANCE: Social History  Substance Use Topics  . Smoking status: Current Every Day Smoker -- 0.50 packs/day for 39 years    Types: Cigarettes  . Smokeless tobacco: Never Used     Comment: social drinker  . Alcohol Use: Yes     Colonoscopy:  PAP:  Bone density:  Lipid panel:  Allergies  Allergen Reactions  . Penicillins Anaphylaxis, Hives, Itching and Other (See Comments)    [place], itching, hives, throat closing Did PCN reaction causing immediate rash, facial/tongue/throat swelling, SOB or lightheadedness with hypotension: Yes Did PCN reaction causing severe rash involving mucus membranes or skin necrosis: Yes Has patient had a PCN reaction that required hospitalization Yes- went to urgent care Has patient had a PCN reaction occurring within the last 10 years: No- happened in the 90's If all of the above answers are "NO", then may proceed with Cephalosporin use.   . Aspirin Nausea And Vomiting and Other (See Comments)    Upset stomach  . Metformin And Related Diarrhea  Tired, stomach was jumping flips, making me feel bad    Current Outpatient Prescriptions  Medication Sig Dispense Refill  . cetirizine (ZYRTEC) 10 MG tablet Take 10 mg by mouth daily.    Marland Kitchen gabapentin (NEURONTIN) 600 MG tablet Take 600 mg by mouth 2 (two) times daily.    Marland Kitchen lisinopril-hydrochlorothiazide (PRINZIDE,ZESTORETIC) 10-12.5 MG per tablet Take 1 tablet by mouth daily.    . psyllium (METAMUCIL SMOOTH TEXTURE) 28 %  packet Take 1 packet by mouth 2 (two) times daily. 30 packet 1  . rosuvastatin (CRESTOR) 10 MG tablet Take 10 mg by mouth daily.    . tamoxifen (NOLVADEX) 20 MG tablet TAKE ONE TABLET BY MOUTH ONCE DAILY 90 tablet 1  . HYDROcodone-acetaminophen (NORCO/VICODIN) 5-325 MG tablet Take 1 tablet by mouth every 6 (six) hours as needed for moderate pain. 60 tablet 0   No current facility-administered medications for this visit.    OBJECTIVE: Filed Vitals:   06/21/15 1051  BP: 102/67  Pulse: 94  Temp: 97 F (36.1 C)  Resp: 18     There is no weight on file to calculate BMI.    ECOG FS:0 - Asymptomatic  General: Well-developed, well-nourished, no acute distress. Eyes: Pink conjunctiva, anicteric sclera. Breasts: Bilateral mastectomy.  Bilateral chest wall without evidence of recurrence. Lungs: Clear to auscultation bilaterally. Heart: Regular rate and rhythm. No rubs, murmurs, or gallops. Abdomen: Soft, nontender, nondistended. No organomegaly noted, normoactive bowel sounds. Musculoskeletal: No edema, cyanosis, or clubbing. Neuro: Alert, answering all questions appropriately. Cranial nerves grossly intact. Skin: No rashes or petechiae noted. Psych: Normal affect.   LAB RESULTS:  CMP     Component Value Date/Time   NA 133* 06/10/2015 1024   NA 144 08/02/2013 0951   K 3.8 06/10/2015 1024   K 3.6 08/02/2013 0951   CL 100* 06/10/2015 1024   CL 106 06/28/2012 1433   CO2 27 06/10/2015 1024   CO2 27 08/02/2013 0951   GLUCOSE 121* 06/10/2015 1024   GLUCOSE 95 08/02/2013 0951   GLUCOSE 119* 06/28/2012 1433   BUN 11 06/10/2015 1024   BUN 10.5 08/02/2013 0951   CREATININE 0.88 06/10/2015 1024   CREATININE 0.9 08/02/2013 0951   CALCIUM 8.4* 06/10/2015 1024   CALCIUM 9.3 08/02/2013 0951   PROT 8.0 06/10/2015 1024   PROT 7.1 08/02/2013 0951   ALBUMIN 3.4* 06/10/2015 1024   ALBUMIN 3.3* 08/02/2013 0951   AST 30 06/10/2015 1024   AST 16 08/02/2013 0951   ALT 28 06/10/2015 1024   ALT  12 08/02/2013 0951   ALKPHOS 92 06/10/2015 1024   ALKPHOS 57 08/02/2013 0951   BILITOT 0.3 06/10/2015 1024   BILITOT 0.21 08/02/2013 0951   GFRNONAA >60 06/10/2015 1024   GFRAA >60 06/10/2015 1024    I No results found for: SPEP  Lab Results  Component Value Date   WBC 7.8 06/10/2015   NEUTROABS 5.1 06/10/2015   HGB 11.6* 06/10/2015   HCT 34.5* 06/10/2015   MCV 78.8* 06/10/2015   PLT 471* 06/10/2015      Chemistry      Component Value Date/Time   NA 133* 06/10/2015 1024   NA 144 08/02/2013 0951   K 3.8 06/10/2015 1024   K 3.6 08/02/2013 0951   CL 100* 06/10/2015 1024   CL 106 06/28/2012 1433   CO2 27 06/10/2015 1024   CO2 27 08/02/2013 0951   BUN 11 06/10/2015 1024   BUN 10.5 08/02/2013 0951   CREATININE 0.88 06/10/2015 1024  CREATININE 0.9 08/02/2013 0951      Component Value Date/Time   CALCIUM 8.4* 06/10/2015 1024   CALCIUM 9.3 08/02/2013 0951   ALKPHOS 92 06/10/2015 1024   ALKPHOS 57 08/02/2013 0951   AST 30 06/10/2015 1024   AST 16 08/02/2013 0951   ALT 28 06/10/2015 1024   ALT 12 08/02/2013 0951   BILITOT 0.3 06/10/2015 1024   BILITOT 0.21 08/02/2013 0951       Lab Results  Component Value Date   LABCA2 26 01/06/2012     Urinalysis    Component Value Date/Time   COLORURINE YELLOW 11/25/2014 1110    STUDIES: No results found.  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 Patient is now status post bilateral mastectomies.   PLAN:   1. Lung mass: Highly suspicious for malignancy. Will be unusual for metastatic breast cancer given the initial stage of her disease. A second primary possibly lung is also a consideration. CA-27-29 is pending. We will get CT guided biopsy in the next several days to confirm diagnosis. Patient will return to clinic 2-3 days after her biopsy to  discuss the results and treatment planning. 2. Pain: Patient was given a prescription for Vicodin today. 3. Fatigue: Secondary to iron deficiency anemia. Patient will require IV Feraheme in the near future. 4. Iron deficiency anemia: IV Feraheme as above. 5. History of breast cancer: Patient currently is on tamoxifen, biopsy as above.   Lloyd Huger, MD   06/21/2015 12:13 PM

## 2015-06-22 LAB — CANCER ANTIGEN 27.29: CA 27.29: 194.7 U/mL — AB (ref 0.0–38.6)

## 2015-06-24 ENCOUNTER — Telehealth: Payer: Self-pay | Admitting: Oncology

## 2015-06-24 NOTE — Telephone Encounter (Signed)
Can we try to get patient scheduled in [place] at either [facility] or [facility]? Thanks.

## 2015-06-24 NOTE — Telephone Encounter (Signed)
There is a backlog of biopsies to be done, if you need it soon, you may want to send her to [facility] or [facility], several patients have had this done. No answer on when the scanner will be replaced

## 2015-06-24 NOTE — Telephone Encounter (Signed)
Patient called to find out status of bx and other scans or tests she might need to do. Please advise, thanks.

## 2015-06-24 NOTE — Telephone Encounter (Signed)
Kerri Carpenter, have you heard back about procedure date for CT guided biopsy?

## 2015-06-24 NOTE — Telephone Encounter (Signed)
Per Pamala Hurry in [place], their scanner has been down and this has resulted in a slowdown of scheduling. She said this patient has not been scheduled yet but she will call back as soon as it is scheduled.

## 2015-06-25 ENCOUNTER — Ambulatory Visit: Payer: Managed Care, Other (non HMO) | Admitting: Oncology

## 2015-06-25 ENCOUNTER — Other Ambulatory Visit: Payer: Self-pay | Admitting: Oncology

## 2015-06-25 ENCOUNTER — Inpatient Hospital Stay: Payer: Managed Care, Other (non HMO)

## 2015-06-25 ENCOUNTER — Inpatient Hospital Stay: Payer: Managed Care, Other (non HMO) | Admitting: Oncology

## 2015-06-25 DIAGNOSIS — R918 Other nonspecific abnormal finding of lung field: Secondary | ICD-10-CM

## 2015-06-27 NOTE — Telephone Encounter (Signed)
Appt 07/02/15 in [place].

## 2015-07-01 ENCOUNTER — Other Ambulatory Visit: Payer: Self-pay | Admitting: Radiology

## 2015-07-02 ENCOUNTER — Ambulatory Visit (HOSPITAL_COMMUNITY)
Admission: RE | Admit: 2015-07-02 | Discharge: 2015-07-02 | Disposition: A | Discharge status: Home / Self Care | Payer: Managed Care, Other (non HMO) | Source: Ambulatory Visit | Attending: Oncology | Admitting: Oncology

## 2015-07-02 ENCOUNTER — Encounter (HOSPITAL_COMMUNITY): Payer: Self-pay

## 2015-07-02 DIAGNOSIS — R918 Other nonspecific abnormal finding of lung field: Secondary | ICD-10-CM

## 2015-07-02 DIAGNOSIS — C771 Secondary and unspecified malignant neoplasm of intrathoracic lymph nodes: Secondary | ICD-10-CM | POA: Insufficient documentation

## 2015-07-02 DIAGNOSIS — Z853 Personal history of malignant neoplasm of breast: Secondary | ICD-10-CM | POA: Insufficient documentation

## 2015-07-02 DIAGNOSIS — R222 Localized swelling, mass and lump, trunk: Secondary | ICD-10-CM | POA: Diagnosis present

## 2015-07-02 LAB — CBC
HEMATOCRIT: 35.8 % — AB (ref 36.0–46.0)
HEMOGLOBIN: 11.1 g/dL — AB (ref 12.0–15.0)
MCH: 25.1 pg — ABNORMAL LOW (ref 26.0–34.0)
MCHC: 31 g/dL (ref 30.0–36.0)
MCV: 80.8 fL (ref 78.0–100.0)
Platelets: 528 10*3/uL — ABNORMAL HIGH (ref 150–400)
RBC: 4.43 MIL/uL (ref 3.87–5.11)
RDW: 14.8 % (ref 11.5–15.5)
WBC: 8.2 10*3/uL (ref 4.0–10.5)

## 2015-07-02 LAB — PROTIME-INR
INR: 1.16 (ref 0.00–1.49)
Prothrombin Time: 15 seconds (ref 11.6–15.2)

## 2015-07-02 LAB — APTT: APTT: 32 s (ref 24–37)

## 2015-07-02 MED ORDER — LIDOCAINE-EPINEPHRINE 1 %-1:100000 IJ SOLN
INTRAMUSCULAR | Status: AC
  Filled 2015-07-02: qty 1

## 2015-07-02 MED ORDER — SODIUM CHLORIDE 0.9 % IV SOLN
Freq: Once | INTRAVENOUS | Status: AC
  Administered 2015-07-02: 10:00:00 via INTRAVENOUS

## 2015-07-02 MED ORDER — FENTANYL CITRATE (PF) 100 MCG/2ML IJ SOLN
INTRAMUSCULAR | Status: AC
  Filled 2015-07-02: qty 2

## 2015-07-02 MED ORDER — MIDAZOLAM HCL 2 MG/2ML IJ SOLN
INTRAMUSCULAR | Status: AC | PRN
  Administered 2015-07-02 (×2): 1 mg via INTRAVENOUS

## 2015-07-02 MED ORDER — FENTANYL CITRATE (PF) 100 MCG/2ML IJ SOLN
INTRAMUSCULAR | Status: AC | PRN
  Administered 2015-07-02: 50 ug via INTRAVENOUS

## 2015-07-02 MED ORDER — MIDAZOLAM HCL 2 MG/2ML IJ SOLN
INTRAMUSCULAR | Status: AC
  Filled 2015-07-02: qty 2

## 2015-07-02 NOTE — Sedation Documentation (Signed)
Patient is resting comfortably. 

## 2015-07-02 NOTE — Consult Note (Signed)
Chief Complaint: Patient was seen in consultation today for CT guided ant mediastinal mass biopsy  Referring Physician(s): Finnegan,Timothy J  Supervising Physician: Sandi Mariscal  Patient Status:  Out-pt  History of Present Illness: Kerri Carpenter is a 60 y.o. female , prior smoker, with past history of breast cancer 2000/recurrence 2013 and now with hx ant chest discomfort and recent CT revealing bilat pulmonary nodules, enlarged mediastinal lymph nodes, left adrenal nodule and large ant mediastinal mass. She presents today for CT guided ant mediastinal mass biopsy.   Past Medical History  Diagnosis Date  . Hypertension   . Anxiety     anxiety attacks recent  . GERD (gastroesophageal reflux disease)     hx  . Breast cancer ([place]) 2000    Previous Right breast cancer 2000, Lumpectomy, rad tx  . Recurrent breast cancer ([place]) 2013    Bilateral Mastectomies    Past Surgical History  Procedure Laterality Date  . Abdominal hysterectomy    . Breast lumpectomy  2000  . Axillary lymph node dissection  02/02/2012    Procedure: AXILLARY LYMPH NODE DISSECTION;  Surgeon: [name], MD;  Location: [facility];  Service: General;  Laterality: Right;  Bilateral mastectomy; left axillary sentinel node biopsy; right axillary lymph node dissection.   . Total mastectomy  02/02/2012    Procedure: TOTAL MASTECTOMY;  Surgeon: [name], MD;  Location: [facility];  Service: General;  Laterality: Bilateral;  Bilateral mastectomy; left axillary sentinel node biopsy; right axillary lymph node dissection.   Marland Kitchen Axillary sentinel node biopsy  02/02/2012    Procedure: AXILLARY SENTINEL NODE BIOPSY;  Surgeon: [name], MD;  Location: [facility];  Service: General;  Laterality: Left;  Bilateral mastectomy; left axillary sentinel node biopsy; right axillary lymph node dissection.     Allergies: Penicillins; Aspirin; and Metformin and related  Medications: Prior to Admission medications     Medication Sig Start Date End Date Taking? Authorizing Provider  cetirizine (ZYRTEC) 10 MG tablet Take 10 mg by mouth daily.   Yes Historical Provider, MD  gabapentin (NEURONTIN) 600 MG tablet Take 600 mg by mouth 2 (two) times daily.   Yes Historical Provider, MD  HYDROcodone-acetaminophen (NORCO/VICODIN) 5-325 MG tablet Take 1 tablet by mouth every 6 (six) hours as needed for moderate pain. 06/21/15  Yes Lloyd Huger, MD  lisinopril-hydrochlorothiazide (PRINZIDE,ZESTORETIC) 10-12.5 MG per tablet Take 1 tablet by mouth daily.   Yes Historical Provider, MD  rosuvastatin (CRESTOR) 10 MG tablet Take 10 mg by mouth daily.   Yes Historical Provider, MD  tamoxifen (NOLVADEX) 20 MG tablet TAKE ONE TABLET BY MOUTH ONCE DAILY 06/10/15  Yes Mayra Reel, NP  psyllium (METAMUCIL SMOOTH TEXTURE) 28 % packet Take 1 packet by mouth 2 (two) times daily. 11/25/14   Dorie Rank, MD     Family History  Problem Relation Age of Onset  . Lung cancer Paternal Aunt     Social History   Social History  . Marital Status: Unknown    Spouse Name: N/A  . Number of Children: N/A  . Years of Education: N/A   Social History Main Topics  . Smoking status: Current Every Day Smoker -- 0.50 packs/day for 39 years    Types: Cigarettes  . Smokeless tobacco: Never Used     Comment: social drinker  . Alcohol Use: Yes  . Drug Use: No  . Sexual Activity: Yes   Other Topics Concern  . None   Social  History Narrative      Review of Systems  Constitutional: Positive for appetite change, fatigue and unexpected weight change.  Respiratory:       Occ cough, dyspnea with exertion, intermittent ant chest discomfort  Cardiovascular: Negative for leg swelling.  Gastrointestinal: Negative for vomiting and blood in stool.       Occ epigastric pain/nausea  Genitourinary: Negative for dysuria and hematuria.  Musculoskeletal: Negative for back pain.  Neurological: Negative for headaches.    Vital Signs: BP 112/60  mmHg  Pulse 90  Temp(Src) 98.4 F (36.9 C)  Resp 20  Ht 4\' 11"  (1.499 m)  Wt 124 lb (56.246 kg)  BMI 25.03 kg/m2  SpO2 98%  Physical Exam  Constitutional: She is oriented to person, place, and time. She appears well-developed and well-nourished.  Cardiovascular: Normal rate and regular rhythm.   Pulmonary/Chest: Effort normal and breath sounds normal.  Abdominal: Soft. Bowel sounds are normal. There is no tenderness.  Musculoskeletal: She exhibits no edema.  Neurological: She is alert and oriented to person, place, and time.    Mallampati Score:     Imaging: Ct Chest W Contrast  06/19/2015  CLINICAL DATA:  Right breast concentric nerve pain. History of right breast cancer. EXAM: CT CHEST WITH CONTRAST TECHNIQUE: Multidetector CT imaging of the chest was performed during intravenous contrast administration. CONTRAST:  73mL ISOVUE-300 IOPAMIDOL (ISOVUE-300) INJECTION 61% COMPARISON:  None. FINDINGS: Mediastinum/Lymph Nodes: Normal heart size. Aortic atherosclerosis noted. Calcification within the LAD coronary artery noted. Large anterior mediastinal mass measures 4.2 x 4.1 cm, image 46 of series 2. Sub- carinal lymph node measures 1.4 cm, image 67 of series 2. Right paratracheal lymph node measures 1 cm, image 48 of series 2. Lungs/Pleura: No pleural fluid. Multiple pulmonary nodules and masses are identified. In the right middle lobe there is a mass which measures 6 x 5.5 cm, image number 92 of series 3. Within the superior segment of the left lower lobe there is a nodule measuring 1.4 cm. Posterior left upper lobe nodule measures 11 mm, image 37 of series 3. Cavitary lesion within the posterior right lower lobe measures 1.9 cm, image 92 of series 3. Upper abdomen: Normal appearance of the right adrenal gland. Nodule containing calcification within the left adrenal gland measures 2.5 cm, image 136 of series 2. Musculoskeletal: No aggressive lytic or sclerotic bone lesions identified.  IMPRESSION: 1. Bilateral pulmonary nodules and masses compatible with metastatic disease. 2. Enlarged mediastinal lymph nodes as well as large anterior mediastinal mass worrisome for mediastinal metastasis. 3. Indeterminate left adrenal nodule. Given the findings within the chest this is also worrisome for metastasis. Electronically Signed   By: Kerby Moors M.D.   On: 06/19/2015 15:31    Labs:  CBC:  Recent Labs  11/25/14 1120 06/10/15 1024 07/02/15 1015  WBC 7.6 7.8 8.2  HGB 11.6* 11.6* 11.1*  HCT 35.6* 34.5* 35.8*  PLT 328 471* 528*    COAGS: No results for input(s): INR, APTT in the last 8760 hours.  BMP:  Recent Labs  11/25/14 1120 06/10/15 1024  NA 139 133*  K 3.1* 3.8  CL 107 100*  CO2 22 27  GLUCOSE 137* 121*  BUN 12 11  CALCIUM 9.2 8.4*  CREATININE 0.81 0.88  GFRNONAA >60 >60  GFRAA >60 >60    LIVER FUNCTION TESTS:  Recent Labs  11/25/14 1120 06/10/15 1024  BILITOT 0.2* 0.3  AST 24 30  ALT 15 28  ALKPHOS 60 92  PROT 7.5  8.0  ALBUMIN 3.8 3.4*    TUMOR MARKERS: No results for input(s): AFPTM, CEA, CA199, CHROMGRNA in the last 8760 hours.  Assessment and Plan: 60 y.o. female , prior smoker, with past history of breast cancer 2000/recurrence 2013 and now with hx ant chest discomfort and recent CT revealing bilat pulmonary nodules, enlarged mediastinal lymph nodes, left adrenal nodule and large ant mediastinal mass. She presents today for CT guided ant mediastinal mass biopsy.Risks and benefits discussed with the patient /family including, but not limited to bleeding, infection, damage to adjacent structures or low yield requiring additional tests.All of the patient's questions were answered, patient is agreeable to proceed.Consent signed and in chart.     Thank you for this interesting consult.  I greatly enjoyed meeting Kerri Carpenter and look forward to participating in their care.  A copy of this report was sent to the requesting provider on  this date.  Electronically Signed: D. Rowe Robert 07/02/2015, 10:43 AM   I spent a total of 25 minutes in face to face in clinical consultation, greater than 50% of which was counseling/coordinating care for CT guided ant mediastinal mass biopsy

## 2015-07-02 NOTE — Discharge Instructions (Signed)

## 2015-07-02 NOTE — Procedures (Signed)
Technically successful CT guided biopsy of anterior mediastinal mass.  No immediate post procedural complications.   Ronny Bacon, MD Pager #: [phone]

## 2015-07-03 ENCOUNTER — Other Ambulatory Visit: Payer: Self-pay | Admitting: *Deleted

## 2015-07-03 ENCOUNTER — Telehealth: Payer: Self-pay | Admitting: *Deleted

## 2015-07-03 MED ORDER — ONDANSETRON HCL 8 MG PO TABS
8.0000 mg | ORAL_TABLET | Freq: Three times a day (TID) | ORAL | Status: AC | PRN
Start: 2015-07-03 — End: ?

## 2015-07-03 NOTE — Telephone Encounter (Signed)
Patient called to report nausea that started yesterday, requesting something be called in. Per Magda Paganini, prescription for Zofran sent to patients pharmacy. Patient notified and verbalized understanding.

## 2015-07-08 ENCOUNTER — Other Ambulatory Visit: Payer: Self-pay | Admitting: Family Medicine

## 2015-07-08 ENCOUNTER — Telehealth: Payer: Self-pay | Admitting: *Deleted

## 2015-07-08 MED ORDER — HYDROCODONE-ACETAMINOPHEN 5-325 MG PO TABS: 1.0000 | ORAL_TABLET | Freq: Four times a day (QID) | ORAL | Status: AC | PRN

## 2015-07-08 NOTE — Telephone Encounter (Signed)
Called to request refill of hydrocodone and reports that she stopped her gabapentin because the hydrocodone works better. I advised her to restart her gabapentin and to use the Hydrocodone as needed. Informed that prescription is ready to pick up

## 2015-07-10 ENCOUNTER — Inpatient Hospital Stay: Payer: Managed Care, Other (non HMO) | Attending: Oncology | Admitting: Oncology

## 2015-07-10 VITALS — BP 106/71 | HR 93 | Temp 98.8°F | Resp 16 | Wt 120.2 lb

## 2015-07-10 DIAGNOSIS — Z170 Estrogen receptor positive status [ER+]: Secondary | ICD-10-CM | POA: Diagnosis not present

## 2015-07-10 DIAGNOSIS — G9389 Other specified disorders of brain: Secondary | ICD-10-CM | POA: Insufficient documentation

## 2015-07-10 DIAGNOSIS — F419 Anxiety disorder, unspecified: Secondary | ICD-10-CM | POA: Diagnosis not present

## 2015-07-10 DIAGNOSIS — Z9013 Acquired absence of bilateral breasts and nipples: Secondary | ICD-10-CM | POA: Insufficient documentation

## 2015-07-10 DIAGNOSIS — I10 Essential (primary) hypertension: Secondary | ICD-10-CM | POA: Diagnosis not present

## 2015-07-10 DIAGNOSIS — Z79810 Long term (current) use of selective estrogen receptor modulators (SERMs): Secondary | ICD-10-CM

## 2015-07-10 DIAGNOSIS — Z923 Personal history of irradiation: Secondary | ICD-10-CM | POA: Diagnosis not present

## 2015-07-10 DIAGNOSIS — D509 Iron deficiency anemia, unspecified: Secondary | ICD-10-CM

## 2015-07-10 DIAGNOSIS — C641 Malignant neoplasm of right kidney, except renal pelvis: Secondary | ICD-10-CM | POA: Diagnosis present

## 2015-07-10 DIAGNOSIS — R978 Other abnormal tumor markers: Secondary | ICD-10-CM | POA: Insufficient documentation

## 2015-07-10 DIAGNOSIS — F17210 Nicotine dependence, cigarettes, uncomplicated: Secondary | ICD-10-CM

## 2015-07-10 DIAGNOSIS — Z79899 Other long term (current) drug therapy: Secondary | ICD-10-CM | POA: Insufficient documentation

## 2015-07-10 DIAGNOSIS — C649 Malignant neoplasm of unspecified kidney, except renal pelvis: Secondary | ICD-10-CM | POA: Diagnosis not present

## 2015-07-10 DIAGNOSIS — C7800 Secondary malignant neoplasm of unspecified lung: Secondary | ICD-10-CM | POA: Insufficient documentation

## 2015-07-10 DIAGNOSIS — C50911 Malignant neoplasm of unspecified site of right female breast: Secondary | ICD-10-CM | POA: Insufficient documentation

## 2015-07-10 DIAGNOSIS — C642 Malignant neoplasm of left kidney, except renal pelvis: Secondary | ICD-10-CM | POA: Insufficient documentation

## 2015-07-10 DIAGNOSIS — R531 Weakness: Secondary | ICD-10-CM

## 2015-07-10 DIAGNOSIS — C781 Secondary malignant neoplasm of mediastinum: Secondary | ICD-10-CM | POA: Diagnosis not present

## 2015-07-10 DIAGNOSIS — K219 Gastro-esophageal reflux disease without esophagitis: Secondary | ICD-10-CM | POA: Insufficient documentation

## 2015-07-10 MED ORDER — PAZOPANIB HCL 200 MG PO TABS: 800.0000 mg | ORAL_TABLET | Freq: Every day | ORAL | Status: DC

## 2015-07-12 NOTE — Progress Notes (Signed)
[place]  [phone]) [phone] [phone]     ID: Marrion Coy OB: 1955-11-20  MR#: [id number]  [id number]  Patient Care Team: Velna Hatchet, MD as PCP - General (Internal Medicine)  CHIEF COMPLAINT:  Chief Complaint  Patient presents with  . Breast Cancer  . Results    INTERVAL HISTORY: Patient returns to clinic today for further evaluation and discussion of her biopsy results. Her pain is better controlled today. She continues to have a poor appetite and is occasionally nauseous. She continues to have significant weakness and fatigue. She has no neurologic complaints. She denies any other pain. She denies any vomiting, constipation, or diarrhea. She has no urinary complaints. Patient otherwise feels well and offers no further specific complaints.    REVIEW OF SYSTEMS:  As per HPI. Otherwise, 10 point system review was negative.  PAST MEDICAL HISTORY: Past Medical History  Diagnosis Date  . Hypertension   . Anxiety     anxiety attacks recent  . GERD (gastroesophageal reflux disease)     hx  . Breast cancer ([place]) 2000    Previous Right breast cancer 2000, Lumpectomy, rad tx  . Recurrent breast cancer ([place]) 2013    Bilateral Mastectomies    PAST SURGICAL HISTORY: Past Surgical History  Procedure Laterality Date  . Abdominal hysterectomy    . Breast lumpectomy  2000  . Axillary lymph node dissection  02/02/2012    Procedure: AXILLARY LYMPH NODE DISSECTION;  Surgeon: [name], MD;  Location: [facility];  Service: General;  Laterality: Right;  Bilateral mastectomy; left axillary sentinel node biopsy; right axillary lymph node dissection.   . Total mastectomy  02/02/2012    Procedure: TOTAL MASTECTOMY;  Surgeon: [name], MD;  Location: [facility];  Service: General;  Laterality: Bilateral;  Bilateral mastectomy; left axillary sentinel node biopsy; right axillary lymph node dissection.   Marland Kitchen Axillary sentinel node biopsy  02/02/2012      Procedure: AXILLARY SENTINEL NODE BIOPSY;  Surgeon: [name], MD;  Location: [facility];  Service: General;  Laterality: Left;  Bilateral mastectomy; left axillary sentinel node biopsy; right axillary lymph node dissection.     FAMILY HISTORY Family History  Problem Relation Age of Onset  . Lung cancer Paternal Aunt     GYNECOLOGIC HISTORY:  No LMP recorded. Patient has had a hysterectomy.      ADVANCED DIRECTIVES:    HEALTH MAINTENANCE: Social History  Substance Use Topics  . Smoking status: Current Every Day Smoker -- 0.50 packs/day for 39 years    Types: Cigarettes  . Smokeless tobacco: Never Used     Comment: social drinker  . Alcohol Use: Yes     Colonoscopy:  PAP:  Bone density:  Lipid panel:  Allergies  Allergen Reactions  . Penicillins Anaphylaxis, Hives, Itching and Other (See Comments)    [place], itching, hives, throat closing Did PCN reaction causing immediate rash, facial/tongue/throat swelling, SOB or lightheadedness with hypotension: Yes Did PCN reaction causing severe rash involving mucus membranes or skin necrosis: Yes Has patient had a PCN reaction that required hospitalization Yes- went to urgent care Has patient had a PCN reaction occurring within the last 10 years: No- happened in the 90's If all of the above answers are "NO", then may proceed with Cephalosporin use.   . Aspirin Nausea And Vomiting and Other (See Comments)    Upset stomach  . Metformin And Related Diarrhea    Tired, stomach was jumping flips, making  me feel bad    Current Outpatient Prescriptions  Medication Sig Dispense Refill  . cetirizine (ZYRTEC) 10 MG tablet Take 10 mg by mouth daily.    Marland Kitchen gabapentin (NEURONTIN) 600 MG tablet Take 600 mg by mouth 2 (two) times daily.    Marland Kitchen HYDROcodone-acetaminophen (NORCO/VICODIN) 5-325 MG tablet Take 1 tablet by mouth every 6 (six) hours as needed for moderate pain. 60 tablet 0  . lisinopril-hydrochlorothiazide (PRINZIDE,ZESTORETIC)  10-12.5 MG per tablet Take 1 tablet by mouth daily.    . ondansetron (ZOFRAN) 8 MG tablet Take 1 tablet (8 mg total) by mouth every 8 (eight) hours as needed for nausea or vomiting. 30 tablet 0  . pazopanib (VOTRIENT) 200 MG tablet Take 4 tablets (800 mg total) by mouth daily. Take on an empty stomach. 120 tablet 5  . psyllium (METAMUCIL SMOOTH TEXTURE) 28 % packet Take 1 packet by mouth 2 (two) times daily. 30 packet 1  . rosuvastatin (CRESTOR) 10 MG tablet Take 10 mg by mouth daily.    . tamoxifen (NOLVADEX) 20 MG tablet TAKE ONE TABLET BY MOUTH ONCE DAILY 90 tablet 1   No current facility-administered medications for this visit.    OBJECTIVE: Filed Vitals:   07/10/15 1555  BP: 106/71  Pulse: 93  Temp: 98.8 F (37.1 C)  Resp: 16     Body mass index is 24.25 kg/(m^2).    ECOG FS:0 - Asymptomatic  General: Well-developed, well-nourished, no acute distress. Eyes: Pink conjunctiva, anicteric sclera. Breasts: Bilateral mastectomy.  Bilateral chest wall without evidence of recurrence. Lungs: Clear to auscultation bilaterally. Heart: Regular rate and rhythm. No rubs, murmurs, or gallops. Abdomen: Soft, nontender, nondistended. No organomegaly noted, normoactive bowel sounds. Musculoskeletal: No edema, cyanosis, or clubbing. Neuro: Alert, answering all questions appropriately. Cranial nerves grossly intact. Skin: No rashes or petechiae noted. Psych: Normal affect.   LAB RESULTS:  CMP     Component Value Date/Time   NA 133* 06/10/2015 1024   NA 144 08/02/2013 0951   K 3.8 06/10/2015 1024   K 3.6 08/02/2013 0951   CL 100* 06/10/2015 1024   CL 106 06/28/2012 1433   CO2 27 06/10/2015 1024   CO2 27 08/02/2013 0951   GLUCOSE 121* 06/10/2015 1024   GLUCOSE 95 08/02/2013 0951   GLUCOSE 119* 06/28/2012 1433   BUN 11 06/10/2015 1024   BUN 10.5 08/02/2013 0951   CREATININE 0.88 06/10/2015 1024   CREATININE 0.9 08/02/2013 0951   CALCIUM 8.4* 06/10/2015 1024   CALCIUM 9.3 08/02/2013  0951   PROT 8.0 06/10/2015 1024   PROT 7.1 08/02/2013 0951   ALBUMIN 3.4* 06/10/2015 1024   ALBUMIN 3.3* 08/02/2013 0951   AST 30 06/10/2015 1024   AST 16 08/02/2013 0951   ALT 28 06/10/2015 1024   ALT 12 08/02/2013 0951   ALKPHOS 92 06/10/2015 1024   ALKPHOS 57 08/02/2013 0951   BILITOT 0.3 06/10/2015 1024   BILITOT 0.21 08/02/2013 0951   GFRNONAA >60 06/10/2015 1024   GFRAA >60 06/10/2015 1024    I No results found for: SPEP  Lab Results  Component Value Date   WBC 8.2 07/02/2015   NEUTROABS 5.1 06/10/2015   HGB 11.1* 07/02/2015   HCT 35.8* 07/02/2015   MCV 80.8 07/02/2015   PLT 528* 07/02/2015      Chemistry      Component Value Date/Time   NA 133* 06/10/2015 1024   NA 144 08/02/2013 0951   K 3.8 06/10/2015 1024   K 3.6 08/02/2013  0951   CL 100* 06/10/2015 1024   CL 106 06/28/2012 1433   CO2 27 06/10/2015 1024   CO2 27 08/02/2013 0951   BUN 11 06/10/2015 1024   BUN 10.5 08/02/2013 0951   CREATININE 0.88 06/10/2015 1024   CREATININE 0.9 08/02/2013 0951      Component Value Date/Time   CALCIUM 8.4* 06/10/2015 1024   CALCIUM 9.3 08/02/2013 0951   ALKPHOS 92 06/10/2015 1024   ALKPHOS 57 08/02/2013 0951   AST 30 06/10/2015 1024   AST 16 08/02/2013 0951   ALT 28 06/10/2015 1024   ALT 12 08/02/2013 0951   BILITOT 0.3 06/10/2015 1024   BILITOT 0.21 08/02/2013 0951       Lab Results  Component Value Date   LABCA2 194.7* 06/21/2015     Urinalysis    Component Value Date/Time   COLORURINE YELLOW 11/25/2014 1110    STUDIES: No results found.  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    PLAN:   1. Renal cell carcinoma: Case discussed with pathology confirming diagnosis. Will order CT scan of the abdomen and pelvis to ensure radiologic correlation. Patient will be initiated on Votrient 800 mg daily. Patient has been instructed not to initiate her treatment until her follow-up  appointment on Jul 18, 2015. 2. Pain: Continue Vicodin as prescribed.  3. Fatigue: Secondary to iron deficiency anemia. Patient will require IV Feraheme in the near future. 4. Iron deficiency anemia: IV Feraheme as above. 5. History of breast cancer: CA-27-29 is elevated at 194. Continue tamoxifen as prescribed, although likely will discontinue once renal cell carcinoma treatment begins.  Approximately 30 minutes was spent in discussion of which greater than 50% was consultation.   Lloyd Huger, MD   07/12/2015 3:57 PM

## 2015-07-16 ENCOUNTER — Ambulatory Visit (HOSPITAL_COMMUNITY): Payer: Managed Care, Other (non HMO)

## 2015-07-16 ENCOUNTER — Telehealth: Payer: Self-pay

## 2015-07-16 NOTE — Telephone Encounter (Signed)
pazopanib (VOTRIENT) 200 MG tablet approved for 6 months.  P/A # [id number]

## 2015-07-17 ENCOUNTER — Other Ambulatory Visit: Payer: Self-pay | Admitting: Oncology

## 2015-07-17 DIAGNOSIS — C649 Malignant neoplasm of unspecified kidney, except renal pelvis: Secondary | ICD-10-CM

## 2015-07-18 ENCOUNTER — Ambulatory Visit (HOSPITAL_COMMUNITY)
Admission: RE | Admit: 2015-07-18 | Discharge: 2015-07-18 | Disposition: A | Discharge status: Home / Self Care | Payer: Managed Care, Other (non HMO) | Source: Ambulatory Visit | Attending: Oncology | Admitting: Oncology

## 2015-07-18 ENCOUNTER — Inpatient Hospital Stay (HOSPITAL_BASED_OUTPATIENT_CLINIC_OR_DEPARTMENT_OTHER): Payer: Managed Care, Other (non HMO) | Admitting: Oncology

## 2015-07-18 ENCOUNTER — Telehealth: Payer: Self-pay | Admitting: *Deleted

## 2015-07-18 ENCOUNTER — Encounter (HOSPITAL_COMMUNITY): Payer: Self-pay

## 2015-07-18 ENCOUNTER — Telehealth: Payer: Self-pay

## 2015-07-18 ENCOUNTER — Ambulatory Visit (HOSPITAL_COMMUNITY): Payer: Managed Care, Other (non HMO)

## 2015-07-18 VITALS — BP 96/68 | HR 105 | Resp 18

## 2015-07-18 DIAGNOSIS — F17210 Nicotine dependence, cigarettes, uncomplicated: Secondary | ICD-10-CM

## 2015-07-18 DIAGNOSIS — C649 Malignant neoplasm of unspecified kidney, except renal pelvis: Secondary | ICD-10-CM

## 2015-07-18 DIAGNOSIS — Z170 Estrogen receptor positive status [ER+]: Secondary | ICD-10-CM | POA: Diagnosis not present

## 2015-07-18 DIAGNOSIS — C7951 Secondary malignant neoplasm of bone: Secondary | ICD-10-CM | POA: Diagnosis not present

## 2015-07-18 DIAGNOSIS — N289 Disorder of kidney and ureter, unspecified: Secondary | ICD-10-CM | POA: Insufficient documentation

## 2015-07-18 DIAGNOSIS — D509 Iron deficiency anemia, unspecified: Secondary | ICD-10-CM

## 2015-07-18 DIAGNOSIS — J90 Pleural effusion, not elsewhere classified: Secondary | ICD-10-CM | POA: Insufficient documentation

## 2015-07-18 DIAGNOSIS — R531 Weakness: Secondary | ICD-10-CM

## 2015-07-18 DIAGNOSIS — C50911 Malignant neoplasm of unspecified site of right female breast: Secondary | ICD-10-CM | POA: Diagnosis not present

## 2015-07-18 DIAGNOSIS — I7090 Unspecified atherosclerosis: Secondary | ICD-10-CM | POA: Insufficient documentation

## 2015-07-18 DIAGNOSIS — C7800 Secondary malignant neoplasm of unspecified lung: Principal | ICD-10-CM

## 2015-07-18 DIAGNOSIS — Z79899 Other long term (current) drug therapy: Secondary | ICD-10-CM

## 2015-07-18 DIAGNOSIS — Z923 Personal history of irradiation: Secondary | ICD-10-CM

## 2015-07-18 DIAGNOSIS — Z9013 Acquired absence of bilateral breasts and nipples: Secondary | ICD-10-CM

## 2015-07-18 DIAGNOSIS — C641 Malignant neoplasm of right kidney, except renal pelvis: Secondary | ICD-10-CM | POA: Diagnosis not present

## 2015-07-18 DIAGNOSIS — Z79810 Long term (current) use of selective estrogen receptor modulators (SERMs): Secondary | ICD-10-CM

## 2015-07-18 MED ORDER — DEXAMETHASONE 4 MG PO TABS: 4.0000 mg | ORAL_TABLET | Freq: Three times a day (TID) | ORAL | Status: AC

## 2015-07-18 MED ORDER — IOPAMIDOL (ISOVUE-300) INJECTION 61%
100.0000 mL | Freq: Once | INTRAVENOUS | Status: AC | PRN
  Administered 2015-07-18: 100 mL via INTRAVENOUS

## 2015-07-18 NOTE — Telephone Encounter (Signed)
Appt for CT and MD scheduled for today per VO Dr Grayland Ormond

## 2015-07-18 NOTE — Telephone Encounter (Signed)
When she called to schedule delivery of Votrient patient said that she has decided not to take this medication and she plans to discuss this with MD today.  They are going to put rx order on file so if patient does decide to take the med just give her a call at [phone] ext [phone].

## 2015-07-18 NOTE — Telephone Encounter (Signed)
Called to report that her condition is deteriorating. She has chest pain and SOB, Radiology has cancelled her CT again. She has nausea and vomiting, is extremely weak and is not eating. She said something has to be done, "I can't keep going on this way" Asking if we can give her something to help with her energy and appetite

## 2015-07-18 NOTE — Progress Notes (Signed)
Patient is not feeling very weak, not able to eat or drink without feeling nasueas (blood pressure is 96/68 today).  She reports that she is feeling so bad that she wants to be admitted in the hospital because she doesn't want to go home and just lie in her bed to die.  MD informed of patient's concerns.

## 2015-07-19 ENCOUNTER — Other Ambulatory Visit: Payer: Self-pay

## 2015-07-19 ENCOUNTER — Emergency Department: Payer: Managed Care, Other (non HMO)

## 2015-07-19 ENCOUNTER — Emergency Department
Admission: EM | Admit: 2015-07-19 | Discharge: 2015-07-19 | Disposition: A | Discharge status: Home / Self Care | Payer: Managed Care, Other (non HMO) | Attending: Emergency Medicine | Admitting: Emergency Medicine

## 2015-07-19 ENCOUNTER — Encounter: Payer: Self-pay | Admitting: Emergency Medicine

## 2015-07-19 DIAGNOSIS — Z79899 Other long term (current) drug therapy: Secondary | ICD-10-CM | POA: Diagnosis not present

## 2015-07-19 DIAGNOSIS — C649 Malignant neoplasm of unspecified kidney, except renal pelvis: Secondary | ICD-10-CM | POA: Insufficient documentation

## 2015-07-19 DIAGNOSIS — Z853 Personal history of malignant neoplasm of breast: Secondary | ICD-10-CM | POA: Insufficient documentation

## 2015-07-19 DIAGNOSIS — R110 Nausea: Secondary | ICD-10-CM | POA: Insufficient documentation

## 2015-07-19 DIAGNOSIS — I10 Essential (primary) hypertension: Secondary | ICD-10-CM | POA: Insufficient documentation

## 2015-07-19 DIAGNOSIS — C641 Malignant neoplasm of right kidney, except renal pelvis: Secondary | ICD-10-CM | POA: Diagnosis not present

## 2015-07-19 DIAGNOSIS — E860 Dehydration: Secondary | ICD-10-CM | POA: Diagnosis not present

## 2015-07-19 DIAGNOSIS — F17210 Nicotine dependence, cigarettes, uncomplicated: Secondary | ICD-10-CM | POA: Insufficient documentation

## 2015-07-19 DIAGNOSIS — R10816 Epigastric abdominal tenderness: Secondary | ICD-10-CM | POA: Diagnosis present

## 2015-07-19 LAB — URINALYSIS COMPLETE WITH MICROSCOPIC (ARMC ONLY)
BACTERIA UA: NONE SEEN
Bilirubin Urine: NEGATIVE
Glucose, UA: NEGATIVE mg/dL
HGB URINE DIPSTICK: NEGATIVE
LEUKOCYTES UA: NEGATIVE
Nitrite: NEGATIVE
PROTEIN: NEGATIVE mg/dL
SPECIFIC GRAVITY, URINE: 1.02 (ref 1.005–1.030)
pH: 5 (ref 5.0–8.0)

## 2015-07-19 LAB — COMPREHENSIVE METABOLIC PANEL
ALBUMIN: 2.6 g/dL — AB (ref 3.5–5.0)
ALT: 37 U/L (ref 14–54)
ANION GAP: 15 (ref 5–15)
AST: 38 U/L (ref 15–41)
Alkaline Phosphatase: 141 U/L — ABNORMAL HIGH (ref 38–126)
BUN: 24 mg/dL — ABNORMAL HIGH (ref 6–20)
CHLORIDE: 100 mmol/L — AB (ref 101–111)
CO2: 19 mmol/L — AB (ref 22–32)
Calcium: 9.2 mg/dL (ref 8.9–10.3)
Creatinine, Ser: 0.97 mg/dL (ref 0.44–1.00)
GFR calc Af Amer: >60 mL/min (ref >60)
GFR calc non Af Amer: >60 mL/min (ref >60)
GLUCOSE: 134 mg/dL — AB (ref 65–99)
POTASSIUM: 3.9 mmol/L (ref 3.5–5.1)
SODIUM: 134 mmol/L — AB (ref 135–145)
Total Bilirubin: 0.7 mg/dL (ref 0.3–1.2)
Total Protein: 7.7 g/dL (ref 6.5–8.1)

## 2015-07-19 LAB — CBC WITH DIFFERENTIAL/PLATELET
BASOS ABS: 0 10*3/uL (ref 0–0.1)
Eosinophils Absolute: 0 10*3/uL (ref 0–0.7)
Eosinophils Relative: 0 %
HCT: 36.8 % (ref 35.0–47.0)
Hemoglobin: 11.9 g/dL — ABNORMAL LOW (ref 12.0–16.0)
Lymphocytes Relative: 5 %
Lymphs Abs: 0.4 10*3/uL — ABNORMAL LOW (ref 1.0–3.6)
MCH: 24.2 pg — ABNORMAL LOW (ref 26.0–34.0)
MCHC: 32.3 g/dL (ref 32.0–36.0)
MCV: 74.8 fL — ABNORMAL LOW (ref 80.0–100.0)
MONO ABS: 0.2 10*3/uL (ref 0.2–0.9)
Neutro Abs: 7.8 10*3/uL — ABNORMAL HIGH (ref 1.4–6.5)
Platelets: 751 10*3/uL — ABNORMAL HIGH (ref 150–440)
RBC: 4.91 MIL/uL (ref 3.80–5.20)
RDW: 15.7 % — AB (ref 11.5–14.5)
WBC: 8.4 10*3/uL (ref 3.6–11.0)

## 2015-07-19 LAB — TROPONIN I: Troponin I: <0.03 ng/mL (ref <0.031)

## 2015-07-19 LAB — LIPASE, BLOOD: LIPASE: 21 U/L (ref 11–51)

## 2015-07-19 MED ORDER — SODIUM CHLORIDE 0.9 % IV BOLUS (SEPSIS)
1000.0000 mL | Freq: Once | INTRAVENOUS | Status: AC
  Administered 2015-07-19: 1000 mL via INTRAVENOUS

## 2015-07-19 MED ORDER — ONDANSETRON HCL 4 MG/2ML IJ SOLN
4.0000 mg | Freq: Once | INTRAMUSCULAR | Status: AC
  Administered 2015-07-19: 4 mg via INTRAVENOUS
  Filled 2015-07-19: qty 2

## 2015-07-19 MED ORDER — MORPHINE SULFATE (PF) 4 MG/ML IV SOLN
4.0000 mg | Freq: Once | INTRAVENOUS | Status: AC
  Administered 2015-07-19: 4 mg via INTRAVENOUS
  Filled 2015-07-19: qty 1

## 2015-07-19 MED ORDER — PROMETHAZINE HCL 25 MG RE SUPP: 25.0000 mg | Freq: Four times a day (QID) | RECTAL | Status: AC | PRN

## 2015-07-19 NOTE — ED Notes (Signed)
Patient c/o diffuse cramping lower bilateral abd pain without current nausea vomiting diarrhea. No alterations in bowel or bladder habits

## 2015-07-19 NOTE — ED Provider Notes (Signed)
CSN: [id number]     Arrival date & time 07/19/15  0849 History   First MD Initiated Contact with Patient 07/19/15 [phone]     Chief Complaint  Patient presents with  . Abdominal Pain     (Consider location/radiation/quality/duration/timing/severity/associated sxs/prior Treatment) The history is provided by the patient.  Kerri Carpenter is a 60 y.o. female history of hypertension, breast cancer and now metastatic renal cancer here presenting with abdominal pain and nausea. Patient has not been eating for the last month or so. Patient has been seen her oncologist Dr. Grayland Ormond. She has CT abdomen pelvis yesterday that showed likely metastatic renal cell carcinoma. She was offered chemotherapy but she refused it and wants to just be comfortable. She was started on Decadron, Norco, votrient. This morning she woke up and has abdominal cramps and nausea. She was unable to keep her medicines down today. Denies any chest pain or fevers.       Past Medical History  Diagnosis Date  . Hypertension   . Anxiety     anxiety attacks recent  . GERD (gastroesophageal reflux disease)     hx  . Breast cancer ([facility]) 2000    Previous Right breast cancer 2000, Lumpectomy, rad tx  . Recurrent breast cancer ([place]) 2013    Bilateral Mastectomies   Past Surgical History  Procedure Laterality Date  . Abdominal hysterectomy    . Breast lumpectomy  2000  . Axillary lymph node dissection  02/02/2012    Procedure: AXILLARY LYMPH NODE DISSECTION;  Surgeon: [name], MD;  Location: [facility];  Service: General;  Laterality: Right;  Bilateral mastectomy; left axillary sentinel node biopsy; right axillary lymph node dissection.   . Total mastectomy  02/02/2012    Procedure: TOTAL MASTECTOMY;  Surgeon: [name], MD;  Location: [facility];  Service: General;  Laterality: Bilateral;  Bilateral mastectomy; left axillary sentinel node biopsy; right axillary lymph node dissection.   Marland Kitchen Axillary sentinel node biopsy   02/02/2012    Procedure: AXILLARY SENTINEL NODE BIOPSY;  Surgeon: [name], MD;  Location: [facility];  Service: General;  Laterality: Left;  Bilateral mastectomy; left axillary sentinel node biopsy; right axillary lymph node dissection.    Family History  Problem Relation Age of Onset  . Lung cancer Paternal Aunt    Social History  Substance Use Topics  . Smoking status: Current Every Day Smoker -- 0.50 packs/day for 39 years    Types: Cigarettes  . Smokeless tobacco: Never Used     Comment: social drinker  . Alcohol Use: Yes   OB History    No data available     Review of Systems  Gastrointestinal: Positive for nausea and abdominal pain.  All other systems reviewed and are negative.     Allergies  Penicillins; Aspirin; and Metformin and related  Home Medications   Prior to Admission medications   Medication Sig Start Date End Date Taking? Authorizing Provider  cetirizine (ZYRTEC) 10 MG tablet Take 10 mg by mouth daily.    Historical Provider, MD  dexamethasone (DECADRON) 4 MG tablet Take 1 tablet (4 mg total) by mouth 3 (three) times daily. 07/18/15   Lloyd Huger, MD  gabapentin (NEURONTIN) 600 MG tablet Take 600 mg by mouth 2 (two) times daily.    Historical Provider, MD  HYDROcodone-acetaminophen (NORCO/VICODIN) 5-325 MG tablet Take 1 tablet by mouth every 6 (six) hours as needed for moderate pain. 07/08/15   Evlyn Kanner, NP  lisinopril-hydrochlorothiazide (  PRINZIDE,ZESTORETIC) 10-12.5 MG per tablet Take 1 tablet by mouth daily.    Historical Provider, MD  ondansetron (ZOFRAN) 8 MG tablet Take 1 tablet (8 mg total) by mouth every 8 (eight) hours as needed for nausea or vomiting. 07/03/15   Evlyn Kanner, NP  pazopanib (VOTRIENT) 200 MG tablet Take 4 tablets (800 mg total) by mouth daily. Take on an empty stomach. 07/10/15   Lloyd Huger, MD  psyllium (METAMUCIL SMOOTH TEXTURE) 28 % packet Take 1 packet by mouth 2 (two) times daily. 11/25/14   Dorie Rank,  MD  rosuvastatin (CRESTOR) 10 MG tablet Take 10 mg by mouth daily.    Historical Provider, MD  tamoxifen (NOLVADEX) 20 MG tablet TAKE ONE TABLET BY MOUTH ONCE DAILY 06/10/15   Mayra Reel, NP   BP 146/69 mmHg  Pulse 84  Resp 17  Ht 4\' 11"  (1.499 m)  Wt 110 lb (49.896 kg)  BMI 22.21 kg/m2  SpO2 96% Physical Exam  Constitutional: She is oriented to person, place, and time.  Chronically ill appearing, uncomfortable   HENT:  Head: Normocephalic.  MM dry   Eyes: Conjunctivae are normal. Pupils are equal, round, and reactive to light.  Neck: Normal range of motion. Neck supple.  Cardiovascular: Normal rate, regular rhythm and normal heart sounds.   Pulmonary/Chest: Effort normal and breath sounds normal. No respiratory distress. She has no wheezes. She has no rales.  Abdominal: Soft. Bowel sounds are normal.  Mild epigastric tenderness, no rebound   Musculoskeletal: Normal range of motion. She exhibits no edema or tenderness.  Neurological: She is alert and oriented to person, place, and time.  Skin: Skin is warm and dry.  Psychiatric: She has a normal mood and affect. Her behavior is normal. Thought content normal.  Nursing note and vitals reviewed.   ED Course  Procedures (including critical care time) Labs Review Labs Reviewed  CBC WITH DIFFERENTIAL/PLATELET - Abnormal; Notable for the following:    Hemoglobin 11.9 (*)    MCV 74.8 (*)    MCH 24.2 (*)    RDW 15.7 (*)    Platelets 751 (*)    Neutro Abs 7.8 (*)    Lymphs Abs 0.4 (*)    All other components within normal limits  COMPREHENSIVE METABOLIC PANEL - Abnormal; Notable for the following:    Sodium 134 (*)    Chloride 100 (*)    CO2 19 (*)    Glucose, Bld 134 (*)    BUN 24 (*)    Albumin 2.6 (*)    Alkaline Phosphatase 141 (*)    All other components within normal limits  URINALYSIS COMPLETEWITH MICROSCOPIC ([facility] ONLY) - Abnormal; Notable for the following:    Color, Urine YELLOW (*)    APPearance HAZY (*)     Ketones, ur 1+ (*)    Squamous Epithelial / LPF 0-5 (*)    All other components within normal limits  LIPASE, BLOOD  TROPONIN I    Imaging Review Ct Abdomen Pelvis W Wo Contrast  07/18/2015  CLINICAL DATA:  60 year old female with newly diagnosed metastatic malignancy in the thorax in May 2017. Specifically, biopsy of a large anterior mediastinal mass demonstrated a poorly differentiated carcinoma suspicious for potential clear cell primary such as a clear cell renal cell carcinoma. Additional history of bilateral breast cancer diagnosed in 2015 status post bilateral mastectomy, chemotherapy and radiation therapy completed in 2016. Nausea and decreased appetite. EXAM: CT ABDOMEN AND PELVIS WITHOUT AND WITH CONTRAST TECHNIQUE: Multidetector  CT imaging of the abdomen and pelvis was performed following the standard protocol before and following the bolus administration of intravenous contrast. CONTRAST:  152mL ISOVUE-300 IOPAMIDOL (ISOVUE-300) INJECTION 61% COMPARISON:  No priors.  Chest CT 06/19/2015. FINDINGS: Lower chest: Compared to prior chest CT, the large right middle lobe mass seen on the prior study has further increased in size, currently measuring up to 6.2 x 6.9 cm (image 7 of series 8), previously only 6.0 x 5.5 cm. Additionally, the previously noted 10 mm left lower lobe pulmonary nodule has significantly increased in size, currently measuring 19 x 14 mm (image 9 of series 8). Several other pulmonary nodules noted in the lung bases on the prior examination have also markedly increased in size, and there are multiple new nodules, compatible with progressive metastatic disease to the lungs. Patchy areas of ground-glass attenuation are noted throughout the right lung base. New small right pleural effusion lying dependently. 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 No other suspicious hepatic lesions are noted. No intra or extrahepatic biliary ductal dilatation. Gallbladder is normal in appearance. Pancreas: No pancreatic mass. No pancreatic ductal dilatation. No pancreatic or peripancreatic fluid or inflammatory changes. Spleen: Unremarkable. Adrenals/Urinary Tract: 2.3 x 2.4 cm left adrenal lesion is stable in size compared to prior CT scan of the chest 06/19/2015 and measures 34 HU on precontrast images, indeterminate. Right adrenal gland is normal in appearance. Well-circumscribed 1.8 cm intermediate to high attenuation (47 HU on precontrast images) lesion in the upper pole of the right kidney measures 60 HU on arterial phase images, 50 HU on portal venous phase images, and 50 HU on delayed images. Right kidney is otherwise normal in appearance. Several tiny sub cm low-attenuation lesions in the left kidney are too small to characterize, but are statistically likely to represent tiny cysts. No hydroureteronephrosis. Urinary bladder is normal in appearance. Stomach/Bowel: Normal appearance of the stomach. No pathologic dilatation of small bowel or colon. Normal appendix. Vascular/Lymphatic: Extensive atherosclerosis throughout the abdominal and pelvic vasculature, without evidence of aneurysm or dissection at this time. No lymphadenopathy noted in the abdomen or pelvis. Reproductive: Status post hysterectomy.  Ovaries are atrophic. Other: No significant volume of ascites.  No pneumoperitoneum. Musculoskeletal: There are no aggressive appearing lytic or blastic lesions noted in the visualized portions of the skeleton. IMPRESSION: 1. Progression of widespread metastatic disease in the thorax, as detailed above, including development of a new small right pleural effusion which is likely to be malignant. 2. 1.8 cm indeterminate lesion in the right kidney  suspicious for potential neoplasm. This could be further evaluated with MRI of the abdomen with and without IV gadolinium. 3. Indeterminate left renal lesion, grossly stable in size compared to 06/19/2015. This remains concerning for potential metastatic disease, and attention at time of follow-up abdominal MRI is recommended. 4. Atherosclerosis. 5. Additional incidental findings, as above. Electronically Signed   By: Vinnie Langton M.D.   On: 07/18/2015 14:18   Dg Abd Acute W/chest  07/19/2015  CLINICAL DATA:  Abdominal pain. EXAM: DG ABDOMEN ACUTE W/ 1V CHEST COMPARISON:  Abdominal CT from yesterday FINDINGS: Oral contrast from yesterday CTA has progressed throughout the colon. Few distended loops of small bowel in the central abdomen but no generalized obstructive pattern. No concerning  intra-abdominal mass effect or calcification. Hyperinflated lungs with known metastases (Breast cancer) and small right effusion. Normal heart size. IMPRESSION: 1. Nonobstructive bowel gas pattern. Oral contrast from yesterday has progressed to the distal colon. 2. COPD and intrathoracic metastases. No acute superimposed finding. Electronically Signed   By: Monte Fantasia M.D.   On: 07/19/2015 10:09   I have personally reviewed and evaluated these images and lab results as part of my medical decision-making.   EKG Interpretation None       ED ECG REPORT I, YAO, DAVID, the attending physician, personally viewed and interpreted this ECG.   Date: 07/19/2015  EKG Time: 9:38  Rate: 89  Rhythm: normal EKG, normal sinus rhythm  Axis: normal  Intervals:none  ST&T Change: nonspecific    MDM   Final diagnoses:  None   Kerri Carpenter is a 60 y.o. female here with abdominal pain likely from renal cell cancer. Patient wants comfort care. Will get labs, acute abdominal series. Will give pain meds and reassess.   12:10 PM WBC nl. Bicarb 19, AG 15 likely from dehydration. UA 1+ ketones. xrays unremarkable.  Eating [employer] burger at bedside, felt better. Doesn't want aggressive treatment for renal cell cancer. Will dc home with phenergan suppository for nausea.     Wandra Arthurs, MD 07/19/15 [phone]

## 2015-07-19 NOTE — Discharge Instructions (Signed)
Stay hydrated.   Use zofran or phenergan suppository for nausea.   Take your medicines with food  See your doctor and oncologist  Return to ER if you have severe abdominal pain, vomiting, fever.

## 2015-07-19 NOTE — ED Notes (Signed)
Reports "kidney cancer that has spread all over", today having increased abd pain and nausea

## 2015-07-22 NOTE — Progress Notes (Signed)
[place]  Telephone:([phone] Fax:(336) [phone]     ID: Marrion Coy OB: [birth year]  MR#: [id number]  [id number]  Patient Care Team: Velna Hatchet, MD as PCP - General (Internal Medicine)  CHIEF COMPLAINT:  Chief Complaint  Patient presents with  . renal cell carcinoma  . Breast Cancer  . Results    INTERVAL HISTORY: Patient returns to clinic today for further evaluation and discussion of her imaging results. Her performance status is declining. She feels increased weakness and fatigue. She has a poor appetite. biopsy results. She has no neurologic complaints. Her pain is adequately controlled.  She denies any vomiting, constipation, or diarrhea. She has no urinary complaints. Patient feels generally terrible, but offers no further specific complaints.    REVIEW OF SYSTEMS:  As per HPI. Otherwise, 10 point system review was negative.  PAST MEDICAL HISTORY: Past Medical History  Diagnosis Date  . Hypertension   . Anxiety     anxiety attacks recent  . GERD (gastroesophageal reflux disease)     hx  . Breast cancer ([place]) 2000    Previous Right breast cancer 2000, Lumpectomy, rad tx  . Recurrent breast cancer ([place]) 2013    Bilateral Mastectomies    PAST SURGICAL HISTORY: Past Surgical History  Procedure Laterality Date  . Abdominal hysterectomy    . Breast lumpectomy  2000  . Axillary lymph node dissection  02/02/2012    Procedure: AXILLARY LYMPH NODE DISSECTION;  Surgeon: [name], MD;  Location: [facility];  Service: General;  Laterality: Right;  Bilateral mastectomy; left axillary sentinel node biopsy; right axillary lymph node dissection.   . Total mastectomy  02/02/2012    Procedure: TOTAL MASTECTOMY;  Surgeon: [name], MD;  Location: [facility];  Service: General;  Laterality: Bilateral;  Bilateral mastectomy; left axillary sentinel node biopsy; right axillary lymph node dissection.   Marland Kitchen Axillary sentinel node biopsy  02/02/2012      Procedure: AXILLARY SENTINEL NODE BIOPSY;  Surgeon: [name], MD;  Location: [facility];  Service: General;  Laterality: Left;  Bilateral mastectomy; left axillary sentinel node biopsy; right axillary lymph node dissection.     FAMILY HISTORY Family History  Problem Relation Age of Onset  . Lung cancer Paternal Aunt     GYNECOLOGIC HISTORY:  No LMP recorded. Patient has had a hysterectomy.      ADVANCED DIRECTIVES:    HEALTH MAINTENANCE: Social History  Substance Use Topics  . Smoking status: Current Every Day Smoker -- 0.50 packs/day for 39 years    Types: Cigarettes  . Smokeless tobacco: Never Used     Comment: social drinker  . Alcohol Use: Yes     Colonoscopy:  PAP:  Bone density:  Lipid panel:  Allergies  Allergen Reactions  . Penicillins Anaphylaxis, Hives, Itching and Other (See Comments)    Whelps, itching, hives, throat closing Did PCN reaction causing immediate rash, facial/tongue/throat swelling, SOB or lightheadedness with hypotension: Yes Did PCN reaction causing severe rash involving mucus membranes or skin necrosis: Yes Has patient had a PCN reaction that required hospitalization Yes- went to urgent care Has patient had a PCN reaction occurring within the last 10 years: No- happened in the 90's If all of the above answers are "NO", then may proceed with Cephalosporin use.   . Aspirin Nausea And Vomiting and Other (See Comments)    Upset stomach  . Metformin And Related Diarrhea    Tired, stomach was jumping flips, making me  feel bad    Current Outpatient Prescriptions  Medication Sig Dispense Refill  . cetirizine (ZYRTEC) 10 MG tablet Take 10 mg by mouth daily.    Marland Kitchen gabapentin (NEURONTIN) 600 MG tablet Take 600 mg by mouth 2 (two) times daily.    Marland Kitchen HYDROcodone-acetaminophen (NORCO/VICODIN) 5-325 MG tablet Take 1 tablet by mouth every 6 (six) hours as needed for moderate pain. 60 tablet 0  . lisinopril-hydrochlorothiazide (PRINZIDE,ZESTORETIC)  10-12.5 MG per tablet Take 1 tablet by mouth daily.    . ondansetron (ZOFRAN) 8 MG tablet Take 1 tablet (8 mg total) by mouth every 8 (eight) hours as needed for nausea or vomiting. 30 tablet 0  . pazopanib (VOTRIENT) 200 MG tablet Take 4 tablets (800 mg total) by mouth daily. Take on an empty stomach. 120 tablet 5  . psyllium (METAMUCIL SMOOTH TEXTURE) 28 % packet Take 1 packet by mouth 2 (two) times daily. 30 packet 1  . rosuvastatin (CRESTOR) 10 MG tablet Take 10 mg by mouth daily.    . tamoxifen (NOLVADEX) 20 MG tablet TAKE ONE TABLET BY MOUTH ONCE DAILY 90 tablet 1  . dexamethasone (DECADRON) 4 MG tablet Take 1 tablet (4 mg total) by mouth 3 (three) times daily. 30 tablet 2  . promethazine (PHENERGAN) 25 MG suppository Place 1 suppository (25 mg total) rectally every 6 (six) hours as needed for nausea or vomiting. 10 suppository 0   No current facility-administered medications for this visit.    OBJECTIVE: Filed Vitals:   07/18/15 1638  BP: 96/68  Pulse: 105  Resp: 18     There is no weight on file to calculate BMI.    ECOG FS:0 - Asymptomatic  General: Well-developed, well-nourished, no acute distress. Sitting in a wheelchair. Eyes: Pink conjunctiva, anicteric sclera. Breasts: Bilateral mastectomy.  Bilateral chest wall without evidence of recurrence. Lungs: Clear to auscultation bilaterally. Heart: Regular rate and rhythm. No rubs, murmurs, or gallops. Abdomen: Soft, nontender, nondistended. No organomegaly noted, normoactive bowel sounds. Musculoskeletal: No edema, cyanosis, or clubbing. Neuro: Alert, answering all questions appropriately. Cranial nerves grossly intact. Skin: No rashes or petechiae noted. Psych: Normal affect.   LAB RESULTS:  CMP     Component Value Date/Time   NA 134* 07/19/2015 0945   NA 144 08/02/2013 0951   K 3.9 07/19/2015 0945   K 3.6 08/02/2013 0951   CL 100* 07/19/2015 0945   CL 106 06/28/2012 1433   CO2 19* 07/19/2015 0945   CO2 27  08/02/2013 0951   GLUCOSE 134* 07/19/2015 0945   GLUCOSE 95 08/02/2013 0951   GLUCOSE 119* 06/28/2012 1433   BUN 24* 07/19/2015 0945   BUN 10.5 08/02/2013 0951   CREATININE 0.97 07/19/2015 0945   CREATININE 0.9 08/02/2013 0951   CALCIUM 9.2 07/19/2015 0945   CALCIUM 9.3 08/02/2013 0951   PROT 7.7 07/19/2015 0945   PROT 7.1 08/02/2013 0951   ALBUMIN 2.6* 07/19/2015 0945   ALBUMIN 3.3* 08/02/2013 0951   AST 38 07/19/2015 0945   AST 16 08/02/2013 0951   ALT 37 07/19/2015 0945   ALT 12 08/02/2013 0951   ALKPHOS 141* 07/19/2015 0945   ALKPHOS 57 08/02/2013 0951   BILITOT 0.7 07/19/2015 0945   BILITOT 0.21 08/02/2013 0951   GFRNONAA >60 07/19/2015 0945   GFRAA >60 07/19/2015 0945    I No results found for: SPEP  Lab Results  Component Value Date   WBC 8.4 07/19/2015   NEUTROABS 7.8* 07/19/2015   HGB 11.9* 07/19/2015   HCT  36.8 07/19/2015   MCV 74.8* 07/19/2015   PLT 751* 07/19/2015      Chemistry      Component Value Date/Time   NA 134* 07/19/2015 0945   NA 144 08/02/2013 0951   K 3.9 07/19/2015 0945   K 3.6 08/02/2013 0951   CL 100* 07/19/2015 0945   CL 106 06/28/2012 1433   CO2 19* 07/19/2015 0945   CO2 27 08/02/2013 0951   BUN 24* 07/19/2015 0945   BUN 10.5 08/02/2013 0951   CREATININE 0.97 07/19/2015 0945   CREATININE 0.9 08/02/2013 0951      Component Value Date/Time   CALCIUM 9.2 07/19/2015 0945   CALCIUM 9.3 08/02/2013 0951   ALKPHOS 141* 07/19/2015 0945   ALKPHOS 57 08/02/2013 0951   AST 38 07/19/2015 0945   AST 16 08/02/2013 0951   ALT 37 07/19/2015 0945   ALT 12 08/02/2013 0951   BILITOT 0.7 07/19/2015 0945   BILITOT 0.21 08/02/2013 0951       Lab Results  Component Value Date   LABCA2 194.7* 06/21/2015     Urinalysis    Component Value Date/Time   COLORURINE YELLOW* 07/19/2015 1115    STUDIES: No results found.  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     PLAN:   1. Renal cell carcinoma: Case discussed with pathology confirming diagnosis. CT scan of the abdomen revealed a 1.8 cm lesion in the right kidney as well as an indeterminate left renal lesion. Patient states she does not wish to initiate treatment with Votrient 800 mg daily. She is unclear if she would like to pursue any treatment at all, but has not ready to enroll in hospice at this time either. Patient will continue to discuss her options with her family and return to clinic on Tuesday, Jul 23, 2015 for further evaluation.  2. Pain: Continue Vicodin as prescribed.  3. Fatigue: Multifactorial including poor appetite. Patient was given 4 mg PO dexamethasone today to help improve her nausea and appetite. 4. Iron deficiency anemia: Patient wishes to pursue IV Feraheme at her next clinic visit to see if this helps with her weakness and fatigue.. 5. History of breast cancer: CA-27-29 is elevated at 194. Continue tamoxifen as prescribed, although likely will discontinue once renal cell carcinoma treatment begins.  Approximately 30 minutes was spent in discussion of which greater than 50% was consultation.   Lloyd Huger, MD   07/22/2015 8:37 AM

## 2015-07-23 ENCOUNTER — Inpatient Hospital Stay (HOSPITAL_BASED_OUTPATIENT_CLINIC_OR_DEPARTMENT_OTHER): Payer: Managed Care, Other (non HMO) | Admitting: Oncology

## 2015-07-23 ENCOUNTER — Other Ambulatory Visit: Payer: Self-pay | Admitting: Family Medicine

## 2015-07-23 ENCOUNTER — Encounter: Payer: Self-pay | Admitting: Family Medicine

## 2015-07-23 ENCOUNTER — Inpatient Hospital Stay: Payer: Managed Care, Other (non HMO)

## 2015-07-23 ENCOUNTER — Ambulatory Visit (HOSPITAL_COMMUNITY): Payer: Managed Care, Other (non HMO)

## 2015-07-23 ENCOUNTER — Ambulatory Visit
Admission: RE | Admit: 2015-07-23 | Discharge: 2015-07-23 | Disposition: A | Discharge status: Home / Self Care | Payer: Managed Care, Other (non HMO) | Source: Ambulatory Visit | Attending: Family Medicine | Admitting: Family Medicine

## 2015-07-23 VITALS — BP 94/64 | HR 106 | Temp 98.1°F | Resp 14

## 2015-07-23 VITALS — BP 101/68 | HR 102

## 2015-07-23 DIAGNOSIS — R531 Weakness: Secondary | ICD-10-CM

## 2015-07-23 DIAGNOSIS — C781 Secondary malignant neoplasm of mediastinum: Secondary | ICD-10-CM | POA: Diagnosis not present

## 2015-07-23 DIAGNOSIS — F17210 Nicotine dependence, cigarettes, uncomplicated: Secondary | ICD-10-CM

## 2015-07-23 DIAGNOSIS — C641 Malignant neoplasm of right kidney, except renal pelvis: Secondary | ICD-10-CM

## 2015-07-23 DIAGNOSIS — C642 Malignant neoplasm of left kidney, except renal pelvis: Secondary | ICD-10-CM | POA: Diagnosis not present

## 2015-07-23 DIAGNOSIS — G9389 Other specified disorders of brain: Secondary | ICD-10-CM | POA: Insufficient documentation

## 2015-07-23 DIAGNOSIS — Z923 Personal history of irradiation: Secondary | ICD-10-CM

## 2015-07-23 DIAGNOSIS — D509 Iron deficiency anemia, unspecified: Secondary | ICD-10-CM

## 2015-07-23 DIAGNOSIS — Z9013 Acquired absence of bilateral breasts and nipples: Secondary | ICD-10-CM

## 2015-07-23 DIAGNOSIS — R609 Edema, unspecified: Secondary | ICD-10-CM | POA: Diagnosis not present

## 2015-07-23 DIAGNOSIS — R978 Other abnormal tumor markers: Secondary | ICD-10-CM

## 2015-07-23 DIAGNOSIS — C799 Secondary malignant neoplasm of unspecified site: Secondary | ICD-10-CM

## 2015-07-23 DIAGNOSIS — Z170 Estrogen receptor positive status [ER+]: Secondary | ICD-10-CM

## 2015-07-23 DIAGNOSIS — J9859 Other diseases of mediastinum, not elsewhere classified: Secondary | ICD-10-CM

## 2015-07-23 DIAGNOSIS — C50911 Malignant neoplasm of unspecified site of right female breast: Secondary | ICD-10-CM

## 2015-07-23 DIAGNOSIS — Z79810 Long term (current) use of selective estrogen receptor modulators (SERMs): Secondary | ICD-10-CM

## 2015-07-23 DIAGNOSIS — C649 Malignant neoplasm of unspecified kidney, except renal pelvis: Secondary | ICD-10-CM

## 2015-07-23 DIAGNOSIS — R9089 Other abnormal findings on diagnostic imaging of central nervous system: Secondary | ICD-10-CM | POA: Diagnosis not present

## 2015-07-23 DIAGNOSIS — Z79899 Other long term (current) drug therapy: Secondary | ICD-10-CM

## 2015-07-23 DIAGNOSIS — C7800 Secondary malignant neoplasm of unspecified lung: Secondary | ICD-10-CM

## 2015-07-23 HISTORY — DX: Malignant neoplasm of unspecified kidney, except renal pelvis: C64.9

## 2015-07-23 MED ORDER — IOPAMIDOL (ISOVUE-300) INJECTION 61%
75.0000 mL | Freq: Once | INTRAVENOUS | Status: AC | PRN
  Administered 2015-07-23: 75 mL via INTRAVENOUS

## 2015-07-23 MED ORDER — SODIUM CHLORIDE 0.9 % IV SOLN
Freq: Once | INTRAVENOUS | Status: AC
  Administered 2015-07-23: 12:00:00 via INTRAVENOUS
  Filled 2015-07-23: qty 1000

## 2015-07-23 MED ORDER — SODIUM CHLORIDE 0.9 % IV SOLN
510.0000 mg | Freq: Once | INTRAVENOUS | Status: AC
  Administered 2015-07-23: 510 mg via INTRAVENOUS
  Filled 2015-07-23: qty 17

## 2015-07-23 MED ORDER — HYDROCORTISONE NA SUCCINATE PF 100 MG IJ SOLR
100.0000 mg | Freq: Once | INTRAMUSCULAR | Status: AC
  Administered 2015-07-23: 100 mg via INTRAVENOUS

## 2015-07-23 MED ORDER — DIPHENHYDRAMINE HCL 50 MG/ML IJ SOLN
25.0000 mg | Freq: Once | INTRAMUSCULAR | Status: AC
  Administered 2015-07-23: 25 mg via INTRAVENOUS

## 2015-07-23 MED ORDER — FENTANYL 25 MCG/HR TD PT72: 25.0000 ug | MEDICATED_PATCH | TRANSDERMAL | Status: AC

## 2015-07-23 NOTE — Progress Notes (Signed)
1223-IV Feraheme started. 1229 pt noted to moan and act like she was trying to get out of chair. Eyes rolled back into head and started shaking, then became unresponsive very briefly. IV Feraheme stopped.  Pt then became very red in the face and neck and c/o of chest pain and SOB. 25mg  IV Benadryl and 200mg  IV Solucortef given. BP 138/75 HR 98. Sats 97% on 4L  due to SOB and chest pain. Dr Grayland Ormond overhead paged and came over with Georgeanne Nim NP. Pt feeling better after meds given, very sleepy but no c/o voiced. BP rechecked, 104/69 HR 99. Sats remain at 97%. Oxygen decreased to 2L. Pt remains at 97%. Per Dr Grayland Ormond pt to have CT of head once stable and feeling better.  1300. Sats 96% on room air, VSS.  1319-Pt feeling better, alert and oriented. Taken to radiology for CT head.

## 2015-07-23 NOTE — Progress Notes (Signed)
Referral faxed to [facility].

## 2015-07-23 NOTE — Progress Notes (Signed)
Patient not able to tolerate there oral steroid because it caused her abdominal pain/cramping and had to go to ER.  The ER adminstered IV Fand Morphine that did help relieve the pain.  Her appetite is doing better but still feeling fatigued.  Would like to discuss if still needing to take the Tamoxifen.  Has right side chest and back pain that is 7/10 on pain scale.  Blood pressure on check today is 94/64 pulse 106

## 2015-07-26 MED ORDER — LEVETIRACETAM 500 MG PO TABS: 500.0000 mg | ORAL_TABLET | Freq: Two times a day (BID) | ORAL | Status: AC

## 2015-07-26 NOTE — Progress Notes (Addendum)
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  INTERVAL HISTORY: Patient returns to clinic today for further evaluation and Consideration of IV Feraheme. Patient states that she took 1 dose of dexamethasone and it caused her significant abdominal pain and cramping and therefore did not take any further doses. She ultimately had to go to the emergency room and received IV morphine but was not admitted to the hospital. Her performance status continues to decline. She continues to have a poor appetite as well as increased weakness and fatigue. She has no neurologic complaints. Her pain is adequately controlled.  She denies any vomiting, constipation, or diarrhea. She has no urinary complaints. Patient feels generally terrible, but offers no further specific complaints.    REVIEW OF SYSTEMS:  As per HPI. Otherwise, 10 point system review was negative.  PAST MEDICAL HISTORY: Past Medical History  Diagnosis Date  . Hypertension   . Anxiety     anxiety attacks recent  . GERD (gastroesophageal reflux disease)     hx  . Breast cancer ([facility]) 2000    Previous Right breast cancer 2000, Lumpectomy, rad tx  . Recurrent breast cancer ([place]) 2013    Bilateral Mastectomies  . Renal cell adenocarcinoma ([place]) 07/23/2015    PAST SURGICAL HISTORY: Past Surgical History  Procedure Laterality Date  . Abdominal hysterectomy    . Breast lumpectomy  2000  . Axillary lymph node dissection  02/02/2012    Procedure: AXILLARY LYMPH NODE DISSECTION;  Surgeon: [name], MD;  Location: [facility];  Service: General;  Laterality: Right;  Bilateral mastectomy; left axillary sentinel node biopsy; right axillary lymph node dissection.   . Total  mastectomy  02/02/2012    Procedure: TOTAL MASTECTOMY;  Surgeon: [name], MD;  Location: [facility];  Service: General;  Laterality: Bilateral;  Bilateral mastectomy; left axillary sentinel node biopsy; right axillary lymph node dissection.   Marland Kitchen Axillary sentinel node biopsy  02/02/2012    Procedure: AXILLARY SENTINEL NODE BIOPSY;  Surgeon: [name], MD;  Location: [facility];  Service: General;  Laterality: Left;  Bilateral mastectomy; left axillary sentinel node biopsy; right axillary lymph node dissection.     FAMILY HISTORY Family History  Problem Relation Age of Onset  . Lung cancer Paternal Aunt     GYNECOLOGIC HISTORY:  No LMP recorded. Patient has had a hysterectomy.      ADVANCED DIRECTIVES:    HEALTH MAINTENANCE: Social History  Substance Use Topics  . Smoking status: Current Every Day Smoker -- 0.50 packs/day for 39 years    Types: Cigarettes  . Smokeless tobacco: Never Used     Comment: social drinker  . Alcohol Use: Yes     Colonoscopy:  PAP:  Bone density:  Lipid panel:  Allergies  Allergen Reactions  . Penicillins Anaphylaxis, Hives, Itching and Other (See Comments)    Whelps, itching, hives, throat closing Did PCN reaction causing immediate rash, facial/tongue/throat swelling, SOB or lightheadedness with hypotension: Yes Did PCN reaction causing severe rash involving mucus membranes or skin necrosis: Yes Has patient had a PCN reaction that required hospitalization Yes- went to urgent care Has patient had a PCN reaction occurring within the last 10 years: No- happened in the 90's If all of  the above answers are "NO", then may proceed with Cephalosporin use.   . Aspirin Nausea And Vomiting and Other (See Comments)    Upset stomach  . Metformin And Related Diarrhea    Tired, stomach was jumping flips, making me feel bad    Current Outpatient Prescriptions  Medication Sig Dispense Refill  . cetirizine (ZYRTEC) 10 MG tablet Take 10 mg by mouth  daily.    Marland Kitchen gabapentin (NEURONTIN) 600 MG tablet Take 600 mg by mouth 2 (two) times daily.    Marland Kitchen HYDROcodone-acetaminophen (NORCO/VICODIN) 5-325 MG tablet Take 1 tablet by mouth every 6 (six) hours as needed for moderate pain. 60 tablet 0  . lisinopril-hydrochlorothiazide (PRINZIDE,ZESTORETIC) 10-12.5 MG per tablet Take 1 tablet by mouth daily.    . ondansetron (ZOFRAN) 8 MG tablet Take 1 tablet (8 mg total) by mouth every 8 (eight) hours as needed for nausea or vomiting. 30 tablet 0  . pazopanib (VOTRIENT) 200 MG tablet Take 4 tablets (800 mg total) by mouth daily. Take on an empty stomach. 120 tablet 5  . promethazine (PHENERGAN) 25 MG suppository Place 1 suppository (25 mg total) rectally every 6 (six) hours as needed for nausea or vomiting. 10 suppository 0  . psyllium (METAMUCIL SMOOTH TEXTURE) 28 % packet Take 1 packet by mouth 2 (two) times daily. 30 packet 1  . rosuvastatin (CRESTOR) 10 MG tablet Take 10 mg by mouth daily.    Marland Kitchen dexamethasone (DECADRON) 4 MG tablet Take 1 tablet (4 mg total) by mouth 3 (three) times daily. (Patient not taking: Reported on 07/23/2015) 30 tablet 2  . fentaNYL (DURAGESIC - DOSED MCG/HR) 25 MCG/HR patch Place 1 patch (25 mcg total) onto the skin every 3 (three) days. 10 patch 0   No current facility-administered medications for this visit.    OBJECTIVE: Filed Vitals:   07/23/15 1119  BP: 94/64  Pulse: 106  Temp: 98.1 F (36.7 C)  Resp: 14     There is no weight on file to calculate BMI.    ECOG FS:0 - Asymptomatic  General: Ill appearing, anicteric sclera. Breasts: Bilateral mastectomy.  Bilateral chest wall without evidence of recurrence. Lungs: Clear to auscultation bilaterally. Heart: Regular rate and rhythm. No rubs, murmurs, or gallops. Abdomen: Soft, nontender, nondistended. No organomegaly noted, normoactive bowel sounds. Musculoskeletal: No edema, cyanosis, or clubbing. Neuro: Alert, answering all questions appropriately. Cranial nerves  grossly intact. Skin: No rashes or petechiae noted. Psych: Normal affect.   LAB RESULTS:  CMP     Component Value Date/Time   NA 134* 07/19/2015 0945   NA 144 08/02/2013 0951   K 3.9 07/19/2015 0945   K 3.6 08/02/2013 0951   CL 100* 07/19/2015 0945   CL 106 06/28/2012 1433   CO2 19* 07/19/2015 0945   CO2 27 08/02/2013 0951   GLUCOSE 134* 07/19/2015 0945   GLUCOSE 95 08/02/2013 0951   GLUCOSE 119* 06/28/2012 1433   BUN 24* 07/19/2015 0945   BUN 10.5 08/02/2013 0951   CREATININE 0.97 07/19/2015 0945   CREATININE 0.9 08/02/2013 0951   CALCIUM 9.2 07/19/2015 0945   CALCIUM 9.3 08/02/2013 0951   PROT 7.7 07/19/2015 0945   PROT 7.1 08/02/2013 0951   ALBUMIN 2.6* 07/19/2015 0945   ALBUMIN 3.3* 08/02/2013 0951   AST 38 07/19/2015 0945   AST 16 08/02/2013 0951   ALT 37 07/19/2015 0945   ALT 12 08/02/2013 0951   ALKPHOS 141* 07/19/2015 0945   ALKPHOS 57 08/02/2013 0951   BILITOT 0.7  07/19/2015 0945   BILITOT 0.21 08/02/2013 0951   GFRNONAA >60 07/19/2015 0945   GFRAA >60 07/19/2015 0945    I No results found for: SPEP  Lab Results  Component Value Date   WBC 8.4 07/19/2015   NEUTROABS 7.8* 07/19/2015   HGB 11.9* 07/19/2015   HCT 36.8 07/19/2015   MCV 74.8* 07/19/2015   PLT 751* 07/19/2015      Chemistry      Component Value Date/Time   NA 134* 07/19/2015 0945   NA 144 08/02/2013 0951   K 3.9 07/19/2015 0945   K 3.6 08/02/2013 0951   CL 100* 07/19/2015 0945   CL 106 06/28/2012 1433   CO2 19* 07/19/2015 0945   CO2 27 08/02/2013 0951   BUN 24* 07/19/2015 0945   BUN 10.5 08/02/2013 0951   CREATININE 0.97 07/19/2015 0945   CREATININE 0.9 08/02/2013 0951      Component Value Date/Time   CALCIUM 9.2 07/19/2015 0945   CALCIUM 9.3 08/02/2013 0951   ALKPHOS 141* 07/19/2015 0945   ALKPHOS 57 08/02/2013 0951   AST 38 07/19/2015 0945   AST 16 08/02/2013 0951   ALT 37 07/19/2015 0945   ALT 12 08/02/2013 0951   BILITOT 0.7 07/19/2015 0945   BILITOT 0.21  08/02/2013 0951       Lab Results  Component Value Date   LABCA2 194.7* 06/21/2015     Urinalysis    Component Value Date/Time   COLORURINE YELLOW* 07/19/2015 1115    STUDIES: No results found.  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  PLAN:   1. 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 CT scan of the abdomen revealed a 1.8 cm lesion in the right kidney as well as an indeterminate left renal lesion. Patient does not wish to initiate any treatments and has requested hospice services.  She has a consultation with radiation oncology for palliative XRT to her chest mass to help alleviate pain.  2. Possible seizure: Patient had a possible seizure while receiving her iron infusion. Stat CT of her head revealed a metastatic focus as the likely etiology. Radiation oncology as above. Patient will likely need additional XRT to her brain lesion. She was not placed on dexamethasone since she could not tolerate it last week. Patient was given a prescription for Keppra as seizure prophylaxis.  2. Pain: Continue Vicodin as prescribed. XRT as above. 3. Fatigue: Multifactorial including poor appetite. Patient  did not tolerate PO dexamethasone. 4. Iron deficiency anemia: Patient receives most of her IV Feraheme prior to her possible seizure. No further interventions are planned at this time.  5. History of breast cancer: CA-27-29 is elevated at 194.  Discontinue tamoxifen.  6. Mediastinal mass: Consistent with poorly differentiated carcinoma favoring renal cell carcinoma.  Approximately 30 minutes was spent in discussion of which greater than 50% was consultation.   Lloyd Huger, MD   07/26/2015 10:38 AM

## 2015-07-29 ENCOUNTER — Encounter: Payer: Self-pay | Admitting: Radiation Oncology

## 2015-07-29 ENCOUNTER — Ambulatory Visit
Admission: RE | Admit: 2015-07-29 | Discharge: 2015-07-29 | Disposition: A | Discharge status: Home / Self Care | Payer: Managed Care, Other (non HMO) | Source: Ambulatory Visit | Attending: Radiation Oncology | Admitting: Radiation Oncology

## 2015-07-29 VITALS — BP 112/76 | HR 101 | Temp 97.3°F | Resp 20

## 2015-07-29 DIAGNOSIS — F17210 Nicotine dependence, cigarettes, uncomplicated: Secondary | ICD-10-CM | POA: Diagnosis not present

## 2015-07-29 DIAGNOSIS — C7931 Secondary malignant neoplasm of brain: Secondary | ICD-10-CM | POA: Diagnosis not present

## 2015-07-29 DIAGNOSIS — Z7984 Long term (current) use of oral hypoglycemic drugs: Secondary | ICD-10-CM | POA: Insufficient documentation

## 2015-07-29 DIAGNOSIS — R5383 Other fatigue: Secondary | ICD-10-CM | POA: Insufficient documentation

## 2015-07-29 DIAGNOSIS — Z510 Encounter for antineoplastic radiation therapy: Secondary | ICD-10-CM | POA: Diagnosis not present

## 2015-07-29 DIAGNOSIS — Z801 Family history of malignant neoplasm of trachea, bronchus and lung: Secondary | ICD-10-CM | POA: Diagnosis not present

## 2015-07-29 DIAGNOSIS — Z923 Personal history of irradiation: Secondary | ICD-10-CM | POA: Insufficient documentation

## 2015-07-29 DIAGNOSIS — Z9013 Acquired absence of bilateral breasts and nipples: Secondary | ICD-10-CM | POA: Insufficient documentation

## 2015-07-29 DIAGNOSIS — Z853 Personal history of malignant neoplasm of breast: Secondary | ICD-10-CM | POA: Insufficient documentation

## 2015-07-29 DIAGNOSIS — I10 Essential (primary) hypertension: Secondary | ICD-10-CM | POA: Diagnosis not present

## 2015-07-29 DIAGNOSIS — C649 Malignant neoplasm of unspecified kidney, except renal pelvis: Secondary | ICD-10-CM | POA: Diagnosis not present

## 2015-07-29 DIAGNOSIS — R634 Abnormal weight loss: Secondary | ICD-10-CM | POA: Diagnosis not present

## 2015-07-29 DIAGNOSIS — R079 Chest pain, unspecified: Secondary | ICD-10-CM | POA: Diagnosis not present

## 2015-07-29 DIAGNOSIS — R531 Weakness: Secondary | ICD-10-CM | POA: Insufficient documentation

## 2015-07-29 DIAGNOSIS — F419 Anxiety disorder, unspecified: Secondary | ICD-10-CM | POA: Diagnosis not present

## 2015-07-29 DIAGNOSIS — K219 Gastro-esophageal reflux disease without esophagitis: Secondary | ICD-10-CM | POA: Diagnosis not present

## 2015-07-29 DIAGNOSIS — Z79899 Other long term (current) drug therapy: Secondary | ICD-10-CM | POA: Insufficient documentation

## 2015-07-29 NOTE — Consult Note (Signed)
Except an outstanding is perfect of Radiation Oncology NEW PATIENT EVALUATION  Name: Kerri Carpenter  MRN: [id number]  Date:   07/29/2015     DOB: 1956/02/08   This 60 y.o. 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  REFERRING PHYSICIAN: Jackolyn Confer, MD  CHIEF COMPLAINT:  Chief Complaint  Patient presents with  . Cancer    Pt is here for initial consultation of metastatic renal cell cancer.      DIAGNOSIS: The primary encounter diagnosis was Brain metastasis ([place]). A diagnosis of Metastatic renal cell carcinoma, unspecified laterality (HCC) was also pertinent to this visit.   PREVIOUS INVESTIGATIONS:  CT scans of chest abdomen and brain all reviewed Clinical notes reviewed   HPI: Patient is a 60 year old female with history of initial right breast cancer status post lumpectomy in the in 2000 develop recurrent cancer of the breast in 2013 status post bilateral mastectomies and adjuvant radiation therapy. Radiation was performed at [facility]. She recently has presented with increasing right-sided chest pain or pain keeping her up at night. She is also lost weight and continues to have significant weakness and fatigue. Workup showed a large anterior chest mass also by lateral pulmonary nodules with probable invasion of the sternum. Biopsy of this mass was positive for poorly differentiated carcinoma with clear cell features with immunohistochemical stains favoring renal cell carcinoma. CT scan of the brain showed 2 lesions in the left frontal and left occipital regions with significant surrounding edema compatible with metastasis. Patient had noted a possible seizure has been put on Keppra and corticosteroids although she had significant abdominal reaction and steroids had to be discontinued. She is seen today for possible palliative radiation therapy to her brain as  well as her anterior mediastinal mass. She says she continues to be quite confused lethargic week. She's currently on narcotics for her chest pain.   PLANNED TREATMENT REGIMEN: Partial brain radiation and palliative radiation therapy to anterior chest  PAST MEDICAL HISTORY:  has a past medical history of Hypertension; Anxiety; GERD (gastroesophageal reflux disease); Breast cancer ([place]) (2000); Recurrent breast cancer ([place]) (2013); and Renal cell adenocarcinoma ([place]) (07/23/2015).    PAST SURGICAL HISTORY:  Past Surgical History  Procedure Laterality Date  . Abdominal hysterectomy    . Breast lumpectomy  2000  . Axillary lymph node dissection  02/02/2012    Procedure: AXILLARY LYMPH NODE DISSECTION;  Surgeon: [name], MD;  Location: [place];  Service: General;  Laterality: Right;  Bilateral mastectomy; left axillary sentinel node biopsy; right axillary lymph node dissection.   . Total mastectomy  02/02/2012    Procedure: TOTAL MASTECTOMY;  Surgeon: [name], MD;  Location: [place];  Service: General;  Laterality: Bilateral;  Bilateral mastectomy; left axillary sentinel node biopsy; right axillary lymph node dissection.   Marland Kitchen Axillary sentinel node biopsy  02/02/2012    Procedure: AXILLARY SENTINEL NODE BIOPSY;  Surgeon: [name], MD;  Location: [place];  Service: General;  Laterality: Left;  Bilateral mastectomy; left axillary sentinel node biopsy; right axillary lymph node dissection.     FAMILY HISTORY: family history includes Lung cancer in her paternal aunt.  SOCIAL HISTORY:  reports that she has been smoking Cigarettes.  She has a 19.5 pack-year smoking history. She has never used smokeless tobacco. She reports that she drinks alcohol. She reports that she does not use illicit drugs.  ALLERGIES: Penicillins; Aspirin; and Metformin and related  MEDICATIONS:  Current Outpatient Prescriptions  Medication Sig Dispense Refill  . cetirizine (ZYRTEC) 10 MG tablet Take 10  mg by mouth daily.    . fentaNYL (DURAGESIC - DOSED MCG/HR) 25 MCG/HR patch Place 1 patch (25 mcg total) onto the skin every 3 (three) days. 10 patch 0  . HYDROcodone-acetaminophen (NORCO/VICODIN) 5-325 MG tablet Take 1 tablet by mouth every 6 (six) hours as needed for moderate pain. 60 tablet 0  . ondansetron (ZOFRAN) 8 MG tablet Take 1 tablet (8 mg total) by mouth every 8 (eight) hours as needed for nausea or vomiting. 30 tablet 0  . promethazine (PHENERGAN) 25 MG suppository Place 1 suppository (25 mg total) rectally every 6 (six) hours as needed for nausea or vomiting. 10 suppository 0  . dexamethasone (DECADRON) 4 MG tablet Take 1 tablet (4 mg total) by mouth 3 (three) times daily. (Patient not taking: Reported on 07/23/2015) 30 tablet 2  . gabapentin (NEURONTIN) 600 MG tablet Take 600 mg by mouth 2 (two) times daily. Reported on 07/29/2015    . levETIRAcetam (KEPPRA) 500 MG tablet Take 1 tablet (500 mg total) by mouth 2 (two) times daily. (Patient not taking: Reported on 07/29/2015) 30 tablet 2  . lisinopril-hydrochlorothiazide (PRINZIDE,ZESTORETIC) 10-12.5 MG per tablet Take 1 tablet by mouth daily. Reported on 07/29/2015    . psyllium (METAMUCIL SMOOTH TEXTURE) 28 % packet Take 1 packet by mouth 2 (two) times daily. (Patient not taking: Reported on 07/29/2015) 30 packet 1  . rosuvastatin (CRESTOR) 10 MG tablet Take 10 mg by mouth daily. Reported on 07/29/2015    . tamoxifen (NOLVADEX) 20 MG tablet Reported on 07/29/2015    . VOTRIENT 200 MG tablet Reported on 07/29/2015     No current facility-administered medications for this encounter.    ECOG PERFORMANCE STATUS:  1 - Symptomatic but completely ambulatory  REVIEW OF SYSTEMS: Except for the lethargy pain poor appetite and failure to thrive Patient denies any weight loss, fatigue, weakness, fever, chills or night sweats. Patient denies any loss of vision, blurred vision. Patient denies any ringing  of the ears or hearing loss. No irregular heartbeat.  Patient denies heart murmur or history of fainting. Patient denies any chest pain or pain radiating to her upper extremities. Patient denies any shortness of breath, difficulty breathing at night, cough or hemoptysis. Patient denies any swelling in the lower legs. Patient denies any nausea vomiting, vomiting of blood, or coffee ground material in the vomitus. Patient denies any stomach pain. Patient states has had normal bowel movements no significant constipation or diarrhea. Patient denies any dysuria, hematuria or significant nocturia. Patient denies any problems walking, swelling in the joints or loss of balance. Patient denies any skin changes, loss of hair or loss of weight. Patient denies any excessive worrying or anxiety or significant depression. Patient denies any problems with insomnia. Patient denies excessive thirst, polyuria, polydipsia. Patient denies any swollen glands, patient denies easy bruising or easy bleeding. Patient denies any recent infections, allergies or URI. Patient "s visual fields have not changed significantly in recent time.    PHYSICAL EXAM: BP 112/76 mmHg  Pulse 101  Temp(Src) 97.3 F (36.3 C)  Resp 20  Wt  Well-developed female in NAD. Crude visual fields within normal range proprioception is intact. Well-developed well-nourished patient in NAD. HEENT reveals PERLA, EOMI, discs not visualized.  Oral cavity is clear. No oral mucosal lesions are identified. Neck is clear without evidence of cervical  or supraclavicular adenopathy. Lungs are clear to A&P. Cardiac examination is essentially unremarkable with regular rate and rhythm without murmur rub or thrill. Abdomen is benign with no organomegaly or masses noted. Motor sensory and DTR levels are equal and symmetric in the upper and lower extremities. Cranial nerves II through XII are grossly intact. Proprioception is intact. No peripheral adenopathy or edema is identified. No motor or sensory levels are noted. Crude  visual fields are within normal range.  LABORATORY DATA: Pathology report reviewed    RADIOLOGY RESULTS: CT scans of head chest abdomen and pelvis all reviewed and compatible with the above-stated findings   IMPRESSION: Stage IV probable renal cell carcinoma with brain and anterior mediastinal and lung metastasis in 60 year old female  PLAN: At this time like to take care of her brain at this time. Would probably do hypofractionated course of radiation therapy to her partial brain incorporating both lesions. Will make that decision at the time of CT simulation. We will use a dose of 3000 cGy in 10 fractions to the partial brain field if we need to incorporate both lesions in close proximity in 1 single field. Risks and benefits of partial brain radiation were explained to her family and the patient and they all seem to comprehend my treatment plan well. Once we have her brain underway also would set up a field to her anterior mediastinal mass. Would like to retrieve her records of her prior chest wall radiation prior to simulating her chest and mediastinum. I personally set up CT simulation for later this week.  I would like to take this opportunity to thank you for allowing me to participate in the care of your patient.Armstead Peaks., MD

## 2015-07-30 ENCOUNTER — Ambulatory Visit
Admission: RE | Admit: 2015-07-30 | Discharge: 2015-07-30 | Disposition: A | Discharge status: Home / Self Care | Payer: Managed Care, Other (non HMO) | Source: Ambulatory Visit | Attending: Radiation Oncology | Admitting: Radiation Oncology

## 2015-07-30 DIAGNOSIS — Z510 Encounter for antineoplastic radiation therapy: Secondary | ICD-10-CM | POA: Diagnosis not present

## 2015-08-02 ENCOUNTER — Other Ambulatory Visit: Payer: Self-pay | Admitting: *Deleted

## 2015-08-02 DIAGNOSIS — C7931 Secondary malignant neoplasm of brain: Secondary | ICD-10-CM

## 2015-08-06 DIAGNOSIS — Z510 Encounter for antineoplastic radiation therapy: Secondary | ICD-10-CM | POA: Diagnosis not present

## 2015-08-07 DIAGNOSIS — Z510 Encounter for antineoplastic radiation therapy: Secondary | ICD-10-CM | POA: Diagnosis not present

## 2015-08-08 ENCOUNTER — Ambulatory Visit
Admission: RE | Admit: 2015-08-08 | Discharge: 2015-08-08 | Disposition: A | Discharge status: Home / Self Care | Payer: Managed Care, Other (non HMO) | Source: Ambulatory Visit | Attending: Radiation Oncology | Admitting: Radiation Oncology

## 2015-08-08 DIAGNOSIS — Z510 Encounter for antineoplastic radiation therapy: Secondary | ICD-10-CM | POA: Diagnosis not present

## 2015-08-09 ENCOUNTER — Ambulatory Visit
Admission: RE | Admit: 2015-08-09 | Discharge: 2015-08-09 | Disposition: A | Discharge status: Home / Self Care | Payer: Managed Care, Other (non HMO) | Source: Ambulatory Visit | Attending: Radiation Oncology | Admitting: Radiation Oncology

## 2015-08-09 DIAGNOSIS — Z510 Encounter for antineoplastic radiation therapy: Secondary | ICD-10-CM | POA: Diagnosis not present

## 2015-08-12 ENCOUNTER — Ambulatory Visit
Admission: RE | Admit: 2015-08-12 | Discharge: 2015-08-12 | Disposition: A | Discharge status: Home / Self Care | Payer: Managed Care, Other (non HMO) | Source: Ambulatory Visit | Attending: Radiation Oncology | Admitting: Radiation Oncology

## 2015-08-12 ENCOUNTER — Encounter: Payer: Self-pay | Admitting: *Deleted

## 2015-08-12 DIAGNOSIS — Z510 Encounter for antineoplastic radiation therapy: Secondary | ICD-10-CM | POA: Diagnosis not present

## 2015-08-13 ENCOUNTER — Ambulatory Visit
Admission: RE | Admit: 2015-08-13 | Discharge: 2015-08-13 | Disposition: A | Discharge status: Home / Self Care | Payer: Managed Care, Other (non HMO) | Source: Ambulatory Visit | Attending: Radiation Oncology | Admitting: Radiation Oncology

## 2015-08-13 DIAGNOSIS — Z510 Encounter for antineoplastic radiation therapy: Secondary | ICD-10-CM | POA: Diagnosis not present

## 2015-08-14 ENCOUNTER — Inpatient Hospital Stay: Payer: Managed Care, Other (non HMO) | Attending: Oncology

## 2015-08-14 ENCOUNTER — Ambulatory Visit
Admission: RE | Admit: 2015-08-14 | Discharge: 2015-08-14 | Disposition: A | Discharge status: Home / Self Care | Payer: Managed Care, Other (non HMO) | Source: Ambulatory Visit | Attending: Radiation Oncology | Admitting: Radiation Oncology

## 2015-08-14 DIAGNOSIS — Z510 Encounter for antineoplastic radiation therapy: Secondary | ICD-10-CM | POA: Diagnosis not present

## 2015-08-15 ENCOUNTER — Ambulatory Visit
Admission: RE | Admit: 2015-08-15 | Discharge: 2015-08-15 | Disposition: A | Discharge status: Home / Self Care | Payer: Managed Care, Other (non HMO) | Source: Ambulatory Visit | Attending: Radiation Oncology | Admitting: Radiation Oncology

## 2015-08-15 ENCOUNTER — Other Ambulatory Visit: Payer: Self-pay | Admitting: *Deleted

## 2015-08-15 DIAGNOSIS — Z510 Encounter for antineoplastic radiation therapy: Secondary | ICD-10-CM | POA: Diagnosis not present

## 2015-08-15 DIAGNOSIS — R0602 Shortness of breath: Secondary | ICD-10-CM

## 2015-08-16 ENCOUNTER — Other Ambulatory Visit: Payer: Self-pay | Admitting: *Deleted

## 2015-08-16 ENCOUNTER — Ambulatory Visit
Admission: RE | Admit: 2015-08-16 | Discharge: 2015-08-16 | Disposition: A | Discharge status: Home / Self Care | Payer: Managed Care, Other (non HMO) | Source: Ambulatory Visit | Attending: Radiation Oncology | Admitting: Radiation Oncology

## 2015-08-16 ENCOUNTER — Ambulatory Visit
Admission: RE | Admit: 2015-08-16 | Discharge: 2015-08-16 | Disposition: A | Discharge status: Home / Self Care | Payer: Managed Care, Other (non HMO) | Source: Ambulatory Visit | Attending: Oncology | Admitting: Oncology

## 2015-08-16 DIAGNOSIS — J90 Pleural effusion, not elsewhere classified: Secondary | ICD-10-CM | POA: Diagnosis not present

## 2015-08-16 DIAGNOSIS — Z510 Encounter for antineoplastic radiation therapy: Secondary | ICD-10-CM | POA: Diagnosis not present

## 2015-08-16 DIAGNOSIS — C50919 Malignant neoplasm of unspecified site of unspecified female breast: Secondary | ICD-10-CM | POA: Insufficient documentation

## 2015-08-16 DIAGNOSIS — R0602 Shortness of breath: Secondary | ICD-10-CM | POA: Diagnosis present

## 2015-08-16 DIAGNOSIS — R222 Localized swelling, mass and lump, trunk: Secondary | ICD-10-CM | POA: Insufficient documentation

## 2015-08-19 ENCOUNTER — Ambulatory Visit
Admission: RE | Admit: 2015-08-19 | Discharge: 2015-08-19 | Disposition: A | Discharge status: Home / Self Care | Payer: Managed Care, Other (non HMO) | Source: Ambulatory Visit | Attending: Radiation Oncology | Admitting: Radiation Oncology

## 2015-08-19 DIAGNOSIS — Z510 Encounter for antineoplastic radiation therapy: Secondary | ICD-10-CM | POA: Diagnosis not present

## 2015-08-20 ENCOUNTER — Ambulatory Visit
Admission: RE | Admit: 2015-08-20 | Discharge: 2015-08-20 | Disposition: A | Discharge status: Home / Self Care | Payer: Managed Care, Other (non HMO) | Source: Ambulatory Visit | Attending: Radiation Oncology | Admitting: Radiation Oncology

## 2015-08-20 DIAGNOSIS — Z510 Encounter for antineoplastic radiation therapy: Secondary | ICD-10-CM | POA: Diagnosis not present

## 2015-08-21 ENCOUNTER — Encounter: Payer: Self-pay | Admitting: Specialist

## 2015-08-21 ENCOUNTER — Inpatient Hospital Stay: Payer: Managed Care, Other (non HMO) | Admitting: Oncology

## 2015-08-21 ENCOUNTER — Ambulatory Visit
Admission: RE | Admit: 2015-08-21 | Discharge: 2015-08-21 | Disposition: A | Discharge status: Home / Self Care | Payer: Managed Care, Other (non HMO) | Source: Ambulatory Visit | Attending: Radiation Oncology | Admitting: Radiation Oncology

## 2015-08-21 ENCOUNTER — Observation Stay
Admission: AD | Admit: 2015-08-21 | Discharge: 2015-08-23 | DRG: 640 | Disposition: A | Discharge status: Hospice | Payer: Managed Care, Other (non HMO) | Source: Ambulatory Visit | Attending: Internal Medicine | Admitting: Internal Medicine

## 2015-08-21 VITALS — BP 115/75 | HR 133 | Temp 97.1°F | Resp 18 | Wt 102.0 lb

## 2015-08-21 DIAGNOSIS — Z79899 Other long term (current) drug therapy: Secondary | ICD-10-CM

## 2015-08-21 DIAGNOSIS — R0902 Hypoxemia: Secondary | ICD-10-CM | POA: Diagnosis present

## 2015-08-21 DIAGNOSIS — R404 Transient alteration of awareness: Secondary | ICD-10-CM | POA: Diagnosis present

## 2015-08-21 DIAGNOSIS — D7589 Other specified diseases of blood and blood-forming organs: Secondary | ICD-10-CM | POA: Diagnosis present

## 2015-08-21 DIAGNOSIS — D72829 Elevated white blood cell count, unspecified: Secondary | ICD-10-CM | POA: Diagnosis present

## 2015-08-21 DIAGNOSIS — E860 Dehydration: Principal | ICD-10-CM | POA: Diagnosis present

## 2015-08-21 DIAGNOSIS — I10 Essential (primary) hypertension: Secondary | ICD-10-CM | POA: Diagnosis present

## 2015-08-21 DIAGNOSIS — Z9013 Acquired absence of bilateral breasts and nipples: Secondary | ICD-10-CM

## 2015-08-21 DIAGNOSIS — Z79891 Long term (current) use of opiate analgesic: Secondary | ICD-10-CM

## 2015-08-21 DIAGNOSIS — K219 Gastro-esophageal reflux disease without esophagitis: Secondary | ICD-10-CM | POA: Diagnosis not present

## 2015-08-21 DIAGNOSIS — Z923 Personal history of irradiation: Secondary | ICD-10-CM | POA: Diagnosis not present

## 2015-08-21 DIAGNOSIS — E785 Hyperlipidemia, unspecified: Secondary | ICD-10-CM | POA: Diagnosis not present

## 2015-08-21 DIAGNOSIS — Z8249 Family history of ischemic heart disease and other diseases of the circulatory system: Secondary | ICD-10-CM

## 2015-08-21 DIAGNOSIS — Z515 Encounter for palliative care: Secondary | ICD-10-CM | POA: Diagnosis not present

## 2015-08-21 DIAGNOSIS — C7931 Secondary malignant neoplasm of brain: Secondary | ICD-10-CM | POA: Diagnosis present

## 2015-08-21 DIAGNOSIS — Z510 Encounter for antineoplastic radiation therapy: Secondary | ICD-10-CM | POA: Diagnosis present

## 2015-08-21 DIAGNOSIS — Z880 Allergy status to penicillin: Secondary | ICD-10-CM | POA: Diagnosis not present

## 2015-08-21 DIAGNOSIS — Z853 Personal history of malignant neoplasm of breast: Secondary | ICD-10-CM | POA: Diagnosis not present

## 2015-08-21 DIAGNOSIS — G893 Neoplasm related pain (acute) (chronic): Secondary | ICD-10-CM | POA: Diagnosis present

## 2015-08-21 DIAGNOSIS — F411 Generalized anxiety disorder: Secondary | ICD-10-CM | POA: Diagnosis present

## 2015-08-21 DIAGNOSIS — R4702 Dysphasia: Secondary | ICD-10-CM | POA: Diagnosis not present

## 2015-08-21 DIAGNOSIS — R531 Weakness: Secondary | ICD-10-CM | POA: Diagnosis not present

## 2015-08-21 DIAGNOSIS — R918 Other nonspecific abnormal finding of lung field: Secondary | ICD-10-CM | POA: Diagnosis not present

## 2015-08-21 DIAGNOSIS — E278 Other specified disorders of adrenal gland: Secondary | ICD-10-CM | POA: Diagnosis present

## 2015-08-21 DIAGNOSIS — R079 Chest pain, unspecified: Secondary | ICD-10-CM | POA: Diagnosis not present

## 2015-08-21 DIAGNOSIS — R1310 Dysphagia, unspecified: Secondary | ICD-10-CM | POA: Diagnosis present

## 2015-08-21 DIAGNOSIS — G9340 Encephalopathy, unspecified: Secondary | ICD-10-CM | POA: Diagnosis present

## 2015-08-21 DIAGNOSIS — R627 Adult failure to thrive: Secondary | ICD-10-CM | POA: Diagnosis not present

## 2015-08-21 DIAGNOSIS — Z7984 Long term (current) use of oral hypoglycemic drugs: Secondary | ICD-10-CM | POA: Diagnosis not present

## 2015-08-21 DIAGNOSIS — R5383 Other fatigue: Secondary | ICD-10-CM | POA: Diagnosis not present

## 2015-08-21 DIAGNOSIS — Z66 Do not resuscitate: Secondary | ICD-10-CM | POA: Diagnosis not present

## 2015-08-21 DIAGNOSIS — C649 Malignant neoplasm of unspecified kidney, except renal pelvis: Secondary | ICD-10-CM | POA: Diagnosis present

## 2015-08-21 DIAGNOSIS — F17210 Nicotine dependence, cigarettes, uncomplicated: Secondary | ICD-10-CM | POA: Diagnosis not present

## 2015-08-21 DIAGNOSIS — Z801 Family history of malignant neoplasm of trachea, bronchus and lung: Secondary | ICD-10-CM | POA: Diagnosis not present

## 2015-08-21 DIAGNOSIS — R634 Abnormal weight loss: Secondary | ICD-10-CM | POA: Diagnosis not present

## 2015-08-21 DIAGNOSIS — F419 Anxiety disorder, unspecified: Secondary | ICD-10-CM | POA: Diagnosis not present

## 2015-08-21 LAB — COMPREHENSIVE METABOLIC PANEL
ALK PHOS: 260 U/L — AB (ref 38–126)
ALT: 21 U/L (ref 14–54)
AST: 40 U/L (ref 15–41)
Albumin: 2.3 g/dL — ABNORMAL LOW (ref 3.5–5.0)
Anion gap: 14 (ref 5–15)
BUN: 25 mg/dL — AB (ref 6–20)
CALCIUM: 9.1 mg/dL (ref 8.9–10.3)
CHLORIDE: 100 mmol/L — AB (ref 101–111)
CO2: 21 mmol/L — AB (ref 22–32)
CREATININE: 0.76 mg/dL (ref 0.44–1.00)
GFR calc non Af Amer: >60 mL/min (ref >60)
GLUCOSE: 119 mg/dL — AB (ref 65–99)
Potassium: 4.8 mmol/L (ref 3.5–5.1)
SODIUM: 135 mmol/L (ref 135–145)
Total Bilirubin: 0.8 mg/dL (ref 0.3–1.2)
Total Protein: 7.1 g/dL (ref 6.5–8.1)

## 2015-08-21 LAB — PROTIME-INR
INR: 1.19
PROTHROMBIN TIME: 15.3 s — AB (ref 11.4–15.0)

## 2015-08-21 LAB — CBC
HCT: 38.1 % (ref 35.0–47.0)
HEMOGLOBIN: 12.3 g/dL (ref 12.0–16.0)
MCH: 23.3 pg — AB (ref 26.0–34.0)
MCHC: 32.2 g/dL (ref 32.0–36.0)
MCV: 72.4 fL — ABNORMAL LOW (ref 80.0–100.0)
PLATELETS: 663 10*3/uL — AB (ref 150–440)
RBC: 5.27 MIL/uL — AB (ref 3.80–5.20)
RDW: 18.1 % — ABNORMAL HIGH (ref 11.5–14.5)
WBC: 16.7 10*3/uL — AB (ref 3.6–11.0)

## 2015-08-21 LAB — BLOOD GAS, ARTERIAL
ACID-BASE DEFICIT: 1 mmol/L (ref 0.0–2.0)
ALLENS TEST (PASS/FAIL): POSITIVE — AB
BICARBONATE: 22.2 meq/L (ref 21.0–28.0)
FIO2: 0.3
O2 Saturation: 93.7 %
PATIENT TEMPERATURE: 37
PH ART: 7.45 (ref 7.350–7.450)
PO2 ART: 66 mmHg — AB (ref 83.0–108.0)
pCO2 arterial: 32 mmHg (ref 32.0–48.0)

## 2015-08-21 MED ORDER — LEVETIRACETAM 500 MG PO TABS
500.0000 mg | ORAL_TABLET | Freq: Two times a day (BID) | ORAL | Status: DC
  Administered 2015-08-21 – 2015-08-23 (×5): 500 mg via ORAL
  Filled 2015-08-21 (×5): qty 1

## 2015-08-21 MED ORDER — GABAPENTIN 600 MG PO TABS
600.0000 mg | ORAL_TABLET | Freq: Two times a day (BID) | ORAL | Status: DC
  Administered 2015-08-21 – 2015-08-23 (×5): 600 mg via ORAL
  Filled 2015-08-21 (×5): qty 1

## 2015-08-21 MED ORDER — ENOXAPARIN SODIUM 40 MG/0.4ML ~~LOC~~ SOLN
40.0000 mg | SUBCUTANEOUS | Status: DC
  Administered 2015-08-21 – 2015-08-22 (×2): 40 mg via SUBCUTANEOUS
  Filled 2015-08-21 (×2): qty 0.4

## 2015-08-21 MED ORDER — ONDANSETRON HCL 4 MG/2ML IJ SOLN: 4.0000 mg | Freq: Four times a day (QID) | INTRAMUSCULAR | Status: DC | PRN

## 2015-08-21 MED ORDER — LORATADINE 10 MG PO TABS
10.0000 mg | ORAL_TABLET | Freq: Every day | ORAL | Status: DC
  Administered 2015-08-21 – 2015-08-23 (×3): 10 mg via ORAL
  Filled 2015-08-21 (×3): qty 1

## 2015-08-21 MED ORDER — SODIUM CHLORIDE 0.9 % IV SOLN
INTRAVENOUS | Status: DC
Start: 2015-08-21 — End: 2015-08-23
  Administered 2015-08-21 – 2015-08-23 (×5): via INTRAVENOUS

## 2015-08-21 MED ORDER — ACETAMINOPHEN 325 MG PO TABS: 650.0000 mg | ORAL_TABLET | Freq: Four times a day (QID) | ORAL | Status: DC | PRN

## 2015-08-21 MED ORDER — CHLORHEXIDINE GLUCONATE 0.12 % MT SOLN
15.0000 mL | Freq: Two times a day (BID) | OROMUCOSAL | Status: DC
  Administered 2015-08-21 – 2015-08-23 (×5): 15 mL via OROMUCOSAL
  Filled 2015-08-21 (×3): qty 15

## 2015-08-21 MED ORDER — MORPHINE SULFATE (PF) 2 MG/ML IV SOLN
2.0000 mg | INTRAVENOUS | Status: DC | PRN
  Administered 2015-08-21 – 2015-08-23 (×3): 2 mg via INTRAVENOUS
  Filled 2015-08-21 (×3): qty 1

## 2015-08-21 MED ORDER — FENTANYL 25 MCG/HR TD PT72: 25.0000 ug | MEDICATED_PATCH | TRANSDERMAL | Status: DC

## 2015-08-21 MED ORDER — DEXAMETHASONE 4 MG PO TABS
4.0000 mg | ORAL_TABLET | Freq: Three times a day (TID) | ORAL | Status: DC
  Administered 2015-08-21 – 2015-08-23 (×5): 4 mg via ORAL
  Filled 2015-08-21 (×7): qty 1

## 2015-08-21 MED ORDER — CETYLPYRIDINIUM CHLORIDE 0.05 % MT LIQD
7.0000 mL | Freq: Two times a day (BID) | OROMUCOSAL | Status: DC
  Administered 2015-08-21 – 2015-08-23 (×4): 7 mL via OROMUCOSAL

## 2015-08-21 MED ORDER — ROSUVASTATIN CALCIUM 5 MG PO TABS
10.0000 mg | ORAL_TABLET | Freq: Every day | ORAL | Status: DC
  Administered 2015-08-21 – 2015-08-23 (×3): 10 mg via ORAL
  Filled 2015-08-21 (×3): qty 2

## 2015-08-21 MED ORDER — ACETAMINOPHEN 650 MG RE SUPP: 650.0000 mg | Freq: Four times a day (QID) | RECTAL | Status: DC | PRN

## 2015-08-21 MED ORDER — ONDANSETRON HCL 4 MG PO TABS: 4.0000 mg | ORAL_TABLET | Freq: Four times a day (QID) | ORAL | Status: DC | PRN

## 2015-08-21 NOTE — Progress Notes (Unsigned)
Would like to discuss option about her eating and nutrients, she is not eating like she needs to be

## 2015-08-21 NOTE — Progress Notes (Unsigned)
Report given to [facility]. Pt escorted to room 129 by nursing staff via wheelchair.

## 2015-08-21 NOTE — Progress Notes (Signed)
Pastoral Care, prayer and prayer shaw provided for patient.

## 2015-08-21 NOTE — H&P (Signed)
[facility] [place] NAME: Kerri Carpenter    MR#:  [id number]  DATE OF BIRTH:  [birth year]  DATE OF ADMISSION:  08/21/2015  PRIMARY CARE PHYSICIAN: Velna Hatchet, MD   REQUESTING/REFERRING PHYSICIAN: Dr. Delight Hoh  CHIEF COMPLAINT:  No chief complaint on file. Adult failure to thrive, Dysphagia  HISTORY OF PRESENT ILLNESS:  Kerri Carpenter  is a 60 y.o. female with a known history of Hypertension, anxiety, GERD, history of breast cancer, advanced renal cell carcinoma who presents to the hospital due to weakness, poor by mouth intake and progressive decline over the past few weeks. Patient herself is a poor historian and is on to different most of the history obtained over the phone with her oncologist and also with the family at bedside. Patient apparently went today for her radiation treatment and after the treatment she was altered, weak and as per the family she has not been eating and drinking well. They attempted to give her some water at the cancer center and she had an aspiration event and became acutely hypoxic. She was therefore brought to the hospital for further evaluation. Patient has had about a 2030 pound weight loss over the past 2-3 months. No night sweats, no chest pain or shortness of breath. No abdominal pain, nausea, vomiting.  PAST MEDICAL HISTORY:   Past Medical History  Diagnosis Date  . Hypertension   . Anxiety     anxiety attacks recent  . GERD (gastroesophageal reflux disease)     hx  . Breast cancer ([place]) 2000    Previous Right breast cancer 2000, Lumpectomy, rad tx  . Recurrent breast cancer ([place]) 2013    Bilateral Mastectomies  . Renal cell adenocarcinoma ([place]) 07/23/2015    PAST SURGICAL HISTORY:   Past Surgical History  Procedure Laterality Date  . Abdominal hysterectomy    . Breast lumpectomy  2000  . Axillary lymph node dissection  02/02/2012    Procedure: AXILLARY LYMPH NODE DISSECTION;   Surgeon: [name], MD;  Location: [place];  Service: General;  Laterality: Right;  Bilateral mastectomy; left axillary sentinel node biopsy; right axillary lymph node dissection.   . Total mastectomy  02/02/2012    Procedure: TOTAL MASTECTOMY;  Surgeon: [name], MD;  Location: [place];  Service: General;  Laterality: Bilateral;  Bilateral mastectomy; left axillary sentinel node biopsy; right axillary lymph node dissection.   Marland Kitchen Axillary sentinel node biopsy  02/02/2012    Procedure: AXILLARY SENTINEL NODE BIOPSY;  Surgeon: [name], MD;  Location: [place];  Service: General;  Laterality: Left;  Bilateral mastectomy; left axillary sentinel node biopsy; right axillary lymph node dissection.     SOCIAL HISTORY:   Social History  Substance Use Topics  . Smoking status: Current Every Day Smoker -- 0.50 packs/day for 39 years    Types: Cigarettes  . Smokeless tobacco: Never Used     Comment: social drinker  . Alcohol Use: Yes    FAMILY HISTORY:   Family History  Problem Relation Age of Onset  . Lung cancer Paternal Aunt   . Hypothyroidism Sister   . Heart disease Mother     DRUG ALLERGIES:   Allergies  Allergen Reactions  . Penicillins Anaphylaxis, Hives, Itching and Other (See Comments)    [place], itching, hives, throat closing Did PCN reaction causing immediate rash, facial/tongue/throat swelling, SOB or lightheadedness with hypotension: Yes Did PCN reaction causing severe rash involving mucus membranes  or skin necrosis: Yes Has patient had a PCN reaction that required hospitalization Yes- went to urgent care Has patient had a PCN reaction occurring within the last 10 years: No- happened in the 90's If all of the above answers are "NO", then may proceed with Cephalosporin use.   . Aspirin Nausea And Vomiting and Other (See Comments)    Upset stomach  . Metformin And Related Diarrhea    Tired, stomach was jumping flips, making me feel bad    REVIEW OF  SYSTEMS:   Review of Systems  Unable to perform ROS: mental acuity    MEDICATIONS AT HOME:   Prior to Admission medications   Medication Sig Start Date End Date Taking? Authorizing Provider  cetirizine (ZYRTEC) 10 MG tablet Take 10 mg by mouth daily.    Historical Provider, MD  dexamethasone (DECADRON) 4 MG tablet Take 1 tablet (4 mg total) by mouth 3 (three) times daily. 07/18/15   Lloyd Huger, MD  fentaNYL (DURAGESIC - DOSED MCG/HR) 25 MCG/HR patch Place 1 patch (25 mcg total) onto the skin every 3 (three) days. 07/23/15   Lloyd Huger, MD  gabapentin (NEURONTIN) 600 MG tablet Take 600 mg by mouth 2 (two) times daily. Reported on 07/29/2015    Historical Provider, MD  HYDROcodone-acetaminophen (NORCO/VICODIN) 5-325 MG tablet Take 1 tablet by mouth every 6 (six) hours as needed for moderate pain. 07/08/15   Evlyn Kanner, NP  levETIRAcetam (KEPPRA) 500 MG tablet Take 1 tablet (500 mg total) by mouth 2 (two) times daily. 07/26/15   Lloyd Huger, MD  lisinopril-hydrochlorothiazide (PRINZIDE,ZESTORETIC) 10-12.5 MG per tablet Take 1 tablet by mouth daily. Reported on 07/29/2015    Historical Provider, MD  LORazepam (ATIVAN) 0.5 MG tablet  08/01/15   Historical Provider, MD  Morphine Sulfate (MORPHINE CONCENTRATE) 10 mg / 0.5 ml concentrated solution  08/01/15   Historical Provider, MD  ondansetron (ZOFRAN) 8 MG tablet Take 1 tablet (8 mg total) by mouth every 8 (eight) hours as needed for nausea or vomiting. 07/03/15   Evlyn Kanner, NP  promethazine (PHENERGAN) 25 MG suppository Place 1 suppository (25 mg total) rectally every 6 (six) hours as needed for nausea or vomiting. 07/19/15   Wandra Arthurs, MD  psyllium (METAMUCIL SMOOTH TEXTURE) 28 % packet Take 1 packet by mouth 2 (two) times daily. 11/25/14   Dorie Rank, MD  rosuvastatin (CRESTOR) 10 MG tablet Take 10 mg by mouth daily. Reported on 07/29/2015    Historical Provider, MD  tamoxifen (NOLVADEX) 20 MG tablet Reported on 07/29/2015  06/20/15   Historical Provider, MD  VOTRIENT 200 MG tablet Reported on 07/29/2015 07/16/15   Historical Provider, MD      VITAL SIGNS:  Blood pressure 120/76, pulse 129, temperature 99 F (37.2 C), temperature source Axillary, resp. rate 20, SpO2 97 %.  PHYSICAL EXAMINATION:  Physical Exam  GENERAL:  60 y.o.-year-old cachectic patient lying in the bed lethargic/encephalopathic in mild distress. EYES: Pupils equal, round, reactive to light and accommodation. No scleral icterus. Extraocular muscles intact.  HEENT: Head atraumatic, normocephalic. Oropharynx and nasopharynx clear. No oropharyngeal erythema, moist oral mucosa  NECK:  Supple, no jugular venous distention. No thyroid enlargement, no tenderness.  LUNGS: Normal breath sounds bilaterally, no wheezing, rales, rhonchi. No use of accessory muscles of respiration.  CARDIOVASCULAR: S1, S2 RRR Tachycardic. No murmurs, rubs, gallops, clicks.  ABDOMEN: Soft, nontender, nondistended. Bowel sounds present. No organomegaly or mass.  EXTREMITIES: No pedal edema, cyanosis, or  clubbing. + 2 pedal & radial pulses b/l.   NEUROLOGIC: Cranial nerves II through XII are intact. No focal Motor or sensory deficits appreciated b/l.  Globally weak PSYCHIATRIC: The patient is alert and oriented x 1. Encephalopathic.  SKIN: No obvious rash, lesion, or ulcer.   LABORATORY PANEL:   CBC No results for input(s): WBC, HGB, HCT, PLT in the last 168 hours. ------------------------------------------------------------------------------------------------------------------  Chemistries  No results for input(s): NA, K, CL, CO2, GLUCOSE, BUN, CREATININE, CALCIUM, MG, AST, ALT, ALKPHOS, BILITOT in the last 168 hours.  Invalid input(s): GFRCGP ------------------------------------------------------------------------------------------------------------------  Cardiac Enzymes No results for input(s): TROPONINI in the last 168  hours. ------------------------------------------------------------------------------------------------------------------  RADIOLOGY:  No results found.   IMPRESSION AND PLAN:   60 year old female with past medical history of breast cancer, hypertension, GERD, anxiety, advanced renal cell carcinoma presents to the hospital due to adult. Thrive and altered mental status.  1. Altered mental status-etiology unclear. I suspect this is likely multifactorial and related to underlying dehydration and deconditioning. -I will check stat metabolic profile electrolytes. Also check an ABG to rule out underlying acidosis. -Follow mental status.  2. Dehydration-secondary to poor by mouth intake. - aggressively rehydrated with IV fluids. Follow clinically.  3. Dysphagia-etiology unclear but possibly could be related to underlying malignancy. -Keep nothing by mouth for now. Get a speech evaluation.  4. Advanced renal cell carcinoma-patient is followed by oncology. She was followed by hospice at home but now the family has reverse her CODE STATUS. I will get oncology consult and also palliative care consult to discuss goals of care.  5. 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  - cont. Keppra.   6. Chronic Pain - due to malignancy.  - cont. Fentanyl patch, PRN IV Morphine.   7. Hyperlipidemia - cont. Crestor.    All the records are reviewed and case discussed with ED provider. Management plans discussed with the patient, family and they are in agreement.  CODE STATUS: Full Code  TOTAL TIME TAKING CARE OF THIS PATIENT: 45 minutes.    Henreitta Leber M.D on 08/21/2015 at 2:19 PM  Between 7am to 6pm - Pager - [phone]  After 6pm go to [email or web address] - password [email or web address] [facility]  Office  [phone]  CC: Primary care physician; Velna Hatchet, MD

## 2015-08-22 ENCOUNTER — Encounter: Payer: Self-pay | Admitting: *Deleted

## 2015-08-22 ENCOUNTER — Inpatient Hospital Stay: Payer: Managed Care, Other (non HMO) | Admitting: Oncology

## 2015-08-22 DIAGNOSIS — R404 Transient alteration of awareness: Secondary | ICD-10-CM

## 2015-08-22 DIAGNOSIS — Z66 Do not resuscitate: Secondary | ICD-10-CM

## 2015-08-22 DIAGNOSIS — F419 Anxiety disorder, unspecified: Secondary | ICD-10-CM

## 2015-08-22 DIAGNOSIS — Z79899 Other long term (current) drug therapy: Secondary | ICD-10-CM

## 2015-08-22 DIAGNOSIS — R531 Weakness: Secondary | ICD-10-CM

## 2015-08-22 DIAGNOSIS — D72829 Elevated white blood cell count, unspecified: Secondary | ICD-10-CM

## 2015-08-22 DIAGNOSIS — C7800 Secondary malignant neoplasm of unspecified lung: Secondary | ICD-10-CM

## 2015-08-22 DIAGNOSIS — R630 Anorexia: Secondary | ICD-10-CM

## 2015-08-22 DIAGNOSIS — Z170 Estrogen receptor positive status [ER+]: Secondary | ICD-10-CM

## 2015-08-22 DIAGNOSIS — Z923 Personal history of irradiation: Secondary | ICD-10-CM

## 2015-08-22 DIAGNOSIS — Z515 Encounter for palliative care: Secondary | ICD-10-CM | POA: Diagnosis not present

## 2015-08-22 DIAGNOSIS — R4182 Altered mental status, unspecified: Secondary | ICD-10-CM

## 2015-08-22 DIAGNOSIS — C642 Malignant neoplasm of left kidney, except renal pelvis: Secondary | ICD-10-CM | POA: Diagnosis not present

## 2015-08-22 DIAGNOSIS — E860 Dehydration: Secondary | ICD-10-CM

## 2015-08-22 DIAGNOSIS — D696 Thrombocytopenia, unspecified: Secondary | ICD-10-CM

## 2015-08-22 DIAGNOSIS — C50911 Malignant neoplasm of unspecified site of right female breast: Secondary | ICD-10-CM

## 2015-08-22 DIAGNOSIS — I10 Essential (primary) hypertension: Secondary | ICD-10-CM

## 2015-08-22 DIAGNOSIS — K219 Gastro-esophageal reflux disease without esophagitis: Secondary | ICD-10-CM

## 2015-08-22 DIAGNOSIS — C641 Malignant neoplasm of right kidney, except renal pelvis: Secondary | ICD-10-CM

## 2015-08-22 DIAGNOSIS — Z87891 Personal history of nicotine dependence: Secondary | ICD-10-CM

## 2015-08-22 DIAGNOSIS — C781 Secondary malignant neoplasm of mediastinum: Secondary | ICD-10-CM | POA: Diagnosis not present

## 2015-08-22 DIAGNOSIS — Z9013 Acquired absence of bilateral breasts and nipples: Secondary | ICD-10-CM

## 2015-08-22 LAB — URINALYSIS COMPLETE WITH MICROSCOPIC (ARMC ONLY)
Bilirubin Urine: NEGATIVE
Glucose, UA: NEGATIVE mg/dL
Hgb urine dipstick: NEGATIVE
LEUKOCYTES UA: NEGATIVE
Nitrite: NEGATIVE
PH: 5 (ref 5.0–8.0)
PROTEIN: NEGATIVE mg/dL
SPECIFIC GRAVITY, URINE: 1.017 (ref 1.005–1.030)
SQUAMOUS EPITHELIAL / LPF: NONE SEEN

## 2015-08-22 LAB — COMPREHENSIVE METABOLIC PANEL
ALT: 19 U/L (ref 14–54)
ANION GAP: 11 (ref 5–15)
AST: 32 U/L (ref 15–41)
Albumin: 2.1 g/dL — ABNORMAL LOW (ref 3.5–5.0)
Alkaline Phosphatase: 241 U/L — ABNORMAL HIGH (ref 38–126)
BUN: 25 mg/dL — ABNORMAL HIGH (ref 6–20)
CHLORIDE: 106 mmol/L (ref 101–111)
CO2: 21 mmol/L — AB (ref 22–32)
Calcium: 8.4 mg/dL — ABNORMAL LOW (ref 8.9–10.3)
Creatinine, Ser: 0.75 mg/dL (ref 0.44–1.00)
GFR calc non Af Amer: >60 mL/min (ref >60)
Glucose, Bld: 151 mg/dL — ABNORMAL HIGH (ref 65–99)
POTASSIUM: 4.6 mmol/L (ref 3.5–5.1)
SODIUM: 138 mmol/L (ref 135–145)
Total Bilirubin: <0.1 mg/dL — ABNORMAL LOW (ref 0.3–1.2)
Total Protein: 6.4 g/dL — ABNORMAL LOW (ref 6.5–8.1)

## 2015-08-22 LAB — CBC
HEMATOCRIT: 36.8 % (ref 35.0–47.0)
Hemoglobin: 11.3 g/dL — ABNORMAL LOW (ref 12.0–16.0)
MCH: 22.4 pg — AB (ref 26.0–34.0)
MCHC: 30.7 g/dL — ABNORMAL LOW (ref 32.0–36.0)
MCV: 72.9 fL — ABNORMAL LOW (ref 80.0–100.0)
PLATELETS: 637 10*3/uL — AB (ref 150–440)
RBC: 5.05 MIL/uL (ref 3.80–5.20)
RDW: 18.4 % — ABNORMAL HIGH (ref 11.5–14.5)
WBC: 14.8 10*3/uL — AB (ref 3.6–11.0)

## 2015-08-22 MED ORDER — DOCUSATE SODIUM 100 MG PO CAPS: 100.0000 mg | ORAL_CAPSULE | Freq: Two times a day (BID) | ORAL | Status: DC | PRN

## 2015-08-22 NOTE — Plan of Care (Signed)
Problem: Activity: Goal: Risk for activity intolerance will decrease Outcome: Not Progressing Patient remained on bedrest this shift.  She is very weak and has poor PO intake.

## 2015-08-22 NOTE — Progress Notes (Signed)
Patient ID: Kerri Carpenter, female   DOB: [birth year], 60 y.o.   MRN: [id number] [facility] PROGRESS NOTE  Kerri Carpenter [id number] DOB: 1956/01/08 DOA: 08/21/2015 PCP: Velna Hatchet, MD  HPI/Subjective: Patient in good spirits and offers no complaints. She was eating sherbet when I saw her. She is very appreciative of the care of everyone giving her.  Objective: Filed Vitals:   08/22/15 1005 08/22/15 1442  BP: 130/66 135/51  Pulse:  113  Temp: 98.7 F (37.1 C) 97.4 F (36.3 C)  Resp: 20    No intake or output data in the 24 hours ending 08/22/15 1614 Filed Weights   08/21/15 1426  Weight: 50.775 kg (111 lb 15 oz)    ROS: Review of Systems  Constitutional: Negative for fever and chills.  Eyes: Negative for blurred vision.  Respiratory: Negative for cough and shortness of breath.   Cardiovascular: Negative for chest pain.  Gastrointestinal: Negative for nausea, vomiting, abdominal pain, diarrhea and constipation.  Genitourinary: Negative for dysuria.  Musculoskeletal: Negative for joint pain.  Neurological: Negative for dizziness and headaches.   Exam: Physical Exam  Constitutional: She is oriented to person, place, and time.  HENT:  Nose: No mucosal edema.  Mouth/Throat: No oropharyngeal exudate or posterior oropharyngeal edema.  Eyes: Conjunctivae, EOM and lids are normal. Pupils are equal, round, and reactive to light.  Neck: No JVD present. Carotid bruit is not present. No edema present. No thyroid mass and no thyromegaly present.  Cardiovascular: S1 normal and S2 normal.  Tachycardia present.  Exam reveals no gallop.   Murmur heard.  Systolic murmur is present with a grade of 2/6  Pulses:      Dorsalis pedis pulses are 2+ on the right side, and 2+ on the left side.  Respiratory: No respiratory distress. She has no wheezes. She has no rhonchi. She has no rales.  GI: Soft. Bowel sounds are normal. There is no tenderness.  Musculoskeletal:   Right ankle: She exhibits no swelling.       Left ankle: She exhibits no swelling.  Lymphadenopathy:    She has no cervical adenopathy.  Neurological: She is alert and oriented to person, place, and time. No cranial nerve deficit.  Skin: Skin is warm. No rash noted. Nails show no clubbing.  Psychiatric: She has a normal mood and affect.      Data Reviewed: Basic Metabolic Panel:  Recent Labs Lab 08/21/15 1430 08/22/15 0326  NA 135 138  K 4.8 4.6  CL 100* 106  CO2 21* 21*  GLUCOSE 119* 151*  BUN 25* 25*  CREATININE 0.76 0.75  CALCIUM 9.1 8.4*   Liver Function Tests:  Recent Labs Lab 08/21/15 1430 08/22/15 0326  AST 40 32  ALT 21 19  ALKPHOS 260* 241*  BILITOT 0.8 <0.1*  PROT 7.1 6.4*  ALBUMIN 2.3* 2.1*   CBC:  Recent Labs Lab 08/21/15 1430 08/22/15 0326  WBC 16.7* 14.8*  HGB 12.3 11.3*  HCT 38.1 36.8  MCV 72.4* 72.9*  PLT 663* 637*    Scheduled Meds: . antiseptic oral rinse  7 mL Mouth Rinse q12n4p  . chlorhexidine  15 mL Mouth Rinse BID  . dexamethasone  4 mg Oral TID  . enoxaparin (LOVENOX) injection  40 mg Subcutaneous Q24H  . fentaNYL  25 mcg Transdermal Q72H  . gabapentin  600 mg Oral BID  . levETIRAcetam  500 mg Oral BID  . loratadine  10 mg Oral Daily  . rosuvastatin  10 mg  Oral Daily   Continuous Infusions: . sodium chloride 125 mL/hr at 08/22/15 0606    Assessment/Plan:  1. Altered mental status. Likely secondary to dehydration. 2. Dehydration secondary to poor oral intake. On IV fluid hydration 3. Dysphasia. Likely related to underlying malignancy. Patient on dysphagia diet. 4. Advanced renal cell carcinoma. Patient will go home tomorrow with hospice. Patient is now a DO NOT RESUSCITATE. 5. 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 Patient is on Keppra 6. Chronic pain on fentanyl patch 7. Hyperlipidemia unspecified. Stop Crestor 8. Tachycardia. Likely dehydration related  Code Status:     Code Status Orders        Start      Ordered   08/22/15 1353  Do not attempt resuscitation (DNR)   Continuous    Question Answer Comment  In the event of cardiac or respiratory ARREST Do not call a "code blue"   In the event of cardiac or respiratory ARREST Do not perform Intubation, CPR, defibrillation or ACLS   In the event of cardiac or respiratory ARREST Use medication by any route, position, wound care, and other measures to relive pain and suffering. May use oxygen, suction and manual treatment of airway obstruction as needed for comfort.      08/22/15 1352    Code Status History    Date Active Date Inactive Code Status Order ID Comments User Context   08/21/2015  2:11 PM 08/22/2015  1:52 PM Full Code [id number]  Henreitta Leber, MD Inpatient   02/02/2012 12:42 PM 02/03/2012  2:30 PM Full Code [id number]  Courtney Heys, RN Inpatient    Advance Directive Documentation        Most Recent Value   Type of Advance Directive  Healthcare Power of Attorney, Living will   Pre-existing out of facility DNR order (yellow form or pink MOST form)     "MOST" Form in Place?       Family Communication: Family at bedside Disposition Plan: Home tomorrow with hospice  Consultants:  Palliative care  Oncology  Time spent: 25 minutes  [place], [place]

## 2015-08-22 NOTE — Consult Note (Signed)
[facility]  [phone] [phone]  ID: Marrion Coy OB: [birth year]  MR#: [id number]  [id number]  Patient Care Team: Velna Hatchet, MD as PCP - General (Internal Medicine)  CHIEF COMPLAINT: Stage IV renal cancer on hospice, dehydration, altered mental status, aspiration.  INTERVAL HISTORY: Patient is a 60 year old female with stage IV renal cell carcinoma who was recently admitted with dehydration, aspiration, and altered mental status. Currently she is significantly improved and back to her baseline. She has continued weakness and fatigue and her performance status has declined over the past several weeks. She has minimal PO intake. She has a poor appetite. She has significant aspiration when she swallows. She has no neurologic complaints. She denies any chest pain or shortness of breath. She has cough secondary to her aspiration. She denies any nausea, vomiting, constipation, or diarrhea. She has no urinary complaints. Patient otherwise feels well and offers no further specific complaints.  REVIEW OF SYSTEMS:   Review of Systems  Constitutional: Positive for weight loss and malaise/fatigue. Negative for fever.  HENT: Negative.   Respiratory: Negative.  Negative for cough and shortness of breath.   Cardiovascular: Negative.  Negative for chest pain.  Gastrointestinal: Negative.  Negative for nausea and vomiting.  Genitourinary: Negative.   Musculoskeletal: Negative.   Neurological: Positive for weakness. Negative for headaches.  Psychiatric/Behavioral: Negative.     As per HPI. Otherwise, a complete review of systems is negatve.  PAST MEDICAL HISTORY: Past Medical History  Diagnosis Date  . Hypertension   . Anxiety     anxiety attacks recent  . GERD (gastroesophageal reflux disease)     hx  . Breast cancer ([place]) 2000    Previous Right breast cancer 2000, Lumpectomy, rad tx  . Recurrent breast cancer ([place]) 2013    Bilateral  Mastectomies  . Renal cell adenocarcinoma ([place]) 07/23/2015    PAST SURGICAL HISTORY: Past Surgical History  Procedure Laterality Date  . Abdominal hysterectomy    . Breast lumpectomy  2000  . Axillary lymph node dissection  02/02/2012    Procedure: AXILLARY LYMPH NODE DISSECTION;  Surgeon: [name], MD;  Location: [facility];  Service: General;  Laterality: Right;  Bilateral mastectomy; left axillary sentinel node biopsy; right axillary lymph node dissection.   . Total mastectomy  02/02/2012    Procedure: TOTAL MASTECTOMY;  Surgeon: [name], MD;  Location: [facility];  Service: General;  Laterality: Bilateral;  Bilateral mastectomy; left axillary sentinel node biopsy; right axillary lymph node dissection.   Marland Kitchen Axillary sentinel node biopsy  02/02/2012    Procedure: AXILLARY SENTINEL NODE BIOPSY;  Surgeon: [name], MD;  Location: [facility];  Service: General;  Laterality: Left;  Bilateral mastectomy; left axillary sentinel node biopsy; right axillary lymph node dissection.     FAMILY HISTORY Family History  Problem Relation Age of Onset  . Lung cancer Paternal Aunt   . Hypothyroidism Sister   . Heart disease Mother        ADVANCED DIRECTIVES:    HEALTH MAINTENANCE: Social History  Substance Use Topics  . Smoking status: Former Smoker -- 0.50 packs/day for 39 years    Types: Cigarettes    Quit date: 03/27/2015  . Smokeless tobacco: Never Used     Comment: social drinker  . Alcohol Use: No     Colonoscopy:  PAP:  Bone density:  Lipid panel:  Allergies  Allergen Reactions  . Penicillins Anaphylaxis, Hives, Itching and Other (See Comments)  [place], itching, hives, throat closing Did PCN reaction causing immediate rash, facial/tongue/throat swelling, SOB or lightheadedness with hypotension: Yes Did PCN reaction causing severe rash involving mucus membranes or skin necrosis: Yes Has patient had a PCN reaction that required hospitalization Yes- went to urgent  care Has patient had a PCN reaction occurring within the last 10 years: No- happened in the 90's If all of the above answers are "NO", then may proceed with Cephalosporin use.   . Aspirin Nausea And Vomiting and Other (See Comments)    Upset stomach  . Metformin And Related Diarrhea    Tired, stomach was jumping flips, making me feel bad    Current Facility-Administered Medications  Medication Dose Route Frequency Provider Last Rate Last Dose  . 0.9 %  sodium chloride infusion   Intravenous Continuous Henreitta Leber, MD 125 mL/hr at 08/22/15 2114    . acetaminophen (TYLENOL) tablet 650 mg  650 mg Oral Q6H PRN Henreitta Leber, MD       Or  . acetaminophen (TYLENOL) suppository 650 mg  650 mg Rectal Q6H PRN Henreitta Leber, MD      . antiseptic oral rinse (CPC / CETYLPYRIDINIUM CHLORIDE 0.05%) solution 7 mL  7 mL Mouth Rinse q12n4p Henreitta Leber, MD   7 mL at 08/22/15 1639  . chlorhexidine (PERIDEX) 0.12 % solution 15 mL  15 mL Mouth Rinse BID Henreitta Leber, MD   15 mL at 08/22/15 2107  . dexamethasone (DECADRON) tablet 4 mg  4 mg Oral TID Henreitta Leber, MD   4 mg at 08/22/15 2107  . docusate sodium (COLACE) capsule 100 mg  100 mg Oral BID PRN Loletha Grayer, MD      . enoxaparin (LOVENOX) injection 40 mg  40 mg Subcutaneous Q24H Henreitta Leber, MD   40 mg at 08/22/15 1500  . fentaNYL (DURAGESIC - dosed mcg/hr) patch 25 mcg  25 mcg Transdermal Q72H Henreitta Leber, MD   25 mcg at 08/21/15 1415  . gabapentin (NEURONTIN) tablet 600 mg  600 mg Oral BID Henreitta Leber, MD   600 mg at 08/22/15 2107  . levETIRAcetam (KEPPRA) tablet 500 mg  500 mg Oral BID Henreitta Leber, MD   500 mg at 08/22/15 2107  . loratadine (CLARITIN) tablet 10 mg  10 mg Oral Daily Henreitta Leber, MD   10 mg at 08/22/15 1640  . morphine 2 MG/ML injection 2 mg  2 mg Intravenous Q3H PRN Henreitta Leber, MD   2 mg at 08/22/15 2113  . ondansetron (ZOFRAN) tablet 4 mg  4 mg Oral Q6H PRN Henreitta Leber, MD       Or    . ondansetron (ZOFRAN) injection 4 mg  4 mg Intravenous Q6H PRN Henreitta Leber, MD      . rosuvastatin (CRESTOR) tablet 10 mg  10 mg Oral Daily Henreitta Leber, MD   10 mg at 08/22/15 1640    OBJECTIVE: Filed Vitals:   08/22/15 1442 08/22/15 2040  BP: 135/51 130/68  Pulse: 113 93  Temp: 97.4 F (36.3 C) 97.8 F (36.6 C)  Resp:  18     Body mass index is 22.6 kg/(m^2).    ECOG FS:2 - Symptomatic, <50% confined to bed  General: ill-appearing, no acute distress. Eyes: Pink conjunctiva, anicteric sclera. HEENT: Normocephalic, moist mucous membranes, clear oropharnyx. Lungs: Clear to auscultation bilaterally. Heart: Regular rate and rhythm. No rubs, murmurs, or gallops. Abdomen: Soft, nontender,  nondistended. No organomegaly noted, normoactive bowel sounds. Musculoskeletal: No edema, cyanosis, or clubbing. Neuro: Alert, answering all questions appropriately. Cranial nerves grossly intact. Skin: No rashes or petechiae noted. Psych: Normal affect.   LAB RESULTS:  Lab Results  Component Value Date   NA 138 08/22/2015   K 4.6 08/22/2015   CL 106 08/22/2015   CO2 21* 08/22/2015   GLUCOSE 151* 08/22/2015   BUN 25* 08/22/2015   CREATININE 0.75 08/22/2015   CALCIUM 8.4* 08/22/2015   PROT 6.4* 08/22/2015   ALBUMIN 2.1* 08/22/2015   AST 32 08/22/2015   ALT 19 08/22/2015   ALKPHOS 241* 08/22/2015   BILITOT <0.1* 08/22/2015   GFRNONAA >60 08/22/2015   GFRAA >60 08/22/2015    Lab Results  Component Value Date   WBC 14.8* 08/22/2015   NEUTROABS 7.8* 07/19/2015   HGB 11.3* 08/22/2015   HCT 36.8 08/22/2015   MCV 72.9* 08/22/2015   PLT 637* 08/22/2015     STUDIES: Dg Chest 2 View  08/16/2015  CLINICAL DATA:  60 year old female with metastatic breast cancer. Shortness of breath for 9 days. Cough since May. Subsequent encounter. EXAM: CHEST  2 VIEW COMPARISON:  07/19/2015 and earlier. FINDINGS: Progressive large right anterior mediastinal mass, now extending from the level of  the thoracic inlet to the right hilum, about 10 cm in diameter radiographically (versus 8 cm in May). Superimposed bilateral 2-6 cm diameter pulmonary metastases. Moderate size right pleural effusion was small in May. No superimposed pneumothorax or pulmonary edema. Tracheal air column contour remains normal. Postoperative changes to the chest wall and right axilla. No destructive osseous lesion identified in the chest. IMPRESSION: 1. Radiographic progression of thoracic metastatic disease since May, including large right anterior mediastinal mass. 2. Progressed and moderate size right pleural effusion. Electronically Signed   By: Genevie Ann M.D.   On: 08/16/2015 12:30    ASSESSMENT: Stage IV renal cancer on hospice, dehydration, altered mental status, aspiration.  PLAN:    1. Renal cancer: Patient confirmed that she is DNR/DNI and wishes to pursue no further treatment. She recently completed XRT to her brain for metastatic disease. Can consider XRT for palliative reasons to her mediastinal mass in the future if needed. No further intervention is needed. 2. Dehydration: Improved with IV fluids. 3. Altered mental status: Resolved. 4. Leukocytosis: Likely reactive, monitor. 5. Thrombocytosis: Likely reactive, monitor. 6. Poor appetite: Patient has agreed that feeding tube or PEG tube would be detrimental. 7. Aspiration: Appreciate speech pathology input. 8. Disposition: Discharge home with continued hospice in the next 1-2 days. Next  Appreciate consult, call with questions.  Patient is DO NOT RESUSCITATE/DO NOT INTUBATE.  Lloyd Huger, MD   08/22/2015 10:48 PM

## 2015-08-22 NOTE — Consult Note (Signed)
Consultation Note Date: 08/22/2015   Patient Name: Kerri Carpenter  DOB: [birth year]  MRN: [id number]  Age / Sex: 60 y.o., female  PCP: Velna Hatchet, MD Referring Physician: Loletha Grayer, MD  Reason for Consultation: Establishing goals of care and Psychosocial/spiritual support  HPI/Patient Profile: 60 y.o. female   admitted on 08/21/2015 with PMH of Hypertension, anxiety, GERD, history of breast cancer, advanced renal cell carcinoma who presents to the hospital due to weakness, poor by mouth intake and progressive decline over the past few weeks.    Patient  went  for her scheduled radiation treatment and after the treatment she was altered, weak and as per the family she has not been eating and drinking well. They attempted to give her some water at the cancer center and she had an aspiration event and became acutely hypoxic. She was therefore brought to the hospital for further evaluation.   Patient has had  a 20-30 pound weight loss over the past 2-3 months.    Chest CT 06-19-15 FINDINGS: Mediastinum/Lymph Nodes: Normal heart size. Aortic atherosclerosis noted. Calcification within the LAD coronary artery noted. Large anterior mediastinal mass measures 4.2 x 4.1 cm, image 46 of series 2. Sub- carinal lymph node measures 1.4 cm, image 67 of series 2. Right paratracheal lymph node measures 1 cm, image 48 of series 2.  Lungs/Pleura: No pleural fluid. Multiple pulmonary nodules and masses are identified. In the right middle lobe there is a mass which measures 6 x 5.5 cm, image number 92 of series 3. Within the superior segment of the left lower lobe there is a nodule measuring 1.4 cm. Posterior left upper lobe nodule measures 11 mm, image 37 of series 3. Cavitary lesion within the posterior right lower lobe measures 1.9 cm, image 92 of series 3.  Upper abdomen: Normal appearance of the right  adrenal gland. Nodule containing calcification within the left adrenal gland measures 2.5 cm, image 136 of series 2.  Musculoskeletal: No aggressive lytic or sclerotic bone lesions identified.  IMPRESSION: 1. Bilateral pulmonary nodules and masses compatible with metastatic disease. 2. Enlarged mediastinal lymph nodes as well as large anterior mediastinal mass worrisome for mediastinal metastasis. 3. Indeterminate left adrenal nodule. Given the findings within the chest this is also worrisome for metastasis.  Patient is under hospice services of [facility], has had continued physical and functional decline.  She and her family struggles with limitations of viable treatment options and facing patient's mortality    Clinical Assessment and Goals of Care:   This NP Wadie Lessen reviewed medical records, received report from team, assessed the patient and then meet at the patient's bedside along with her large family, several sisters, husband and others   to discuss diagnosis, prognosis, GOC, EOL wishes disposition and options.   A  discussion was had today regarding advanced directives.  Concepts specific to code status, artifical feeding and hydration, continued IV antibiotics and rehospitalization was had.  The difference between a aggressive medical intervention path  and a palliative comfort care path  for this patient at this time was had.  Values and goals of care important to patient and family were attempted to be elicited.  Concept of Hospice and Palliative Care were discussed  Natural trajectory and expectations at EOL were discussed.  Questions and concerns addressed.  Hard Choices booklet left for review. Family encouraged to call with questions or concerns.     SUMMARY OF RECOMMENDATIONS     -patient is making her own decisions today  with the support of her family  Code Status/Advance Care Planning:   DNR- documented today after discussion with Dr Grayland Ormond and f/u  from this NP    Symptom Management:   Dyspahgia: Diet as recommended by speech/Dysgaia II- educated on reducing aspiration risk  Palliative Prophylaxis:    Aspiration, Bowel Regimen, Frequent Pain Assessment and Oral Care  Additional Recommendations (Limitations, Scope, Preferences):  Full Comfort Care  Psycho-social/Spiritual:     Additional Recommendations: Education on Hospice  Prognosis:   < 4 weeks  Discharge Planning: Home with Hospice      Primary Diagnoses: Present on Admission:  . Altered mental status  I have reviewed the medical record, interviewed the patient and family, and examined the patient. The following aspects are pertinent.  Past Medical History  Diagnosis Date  . Hypertension   . Anxiety     anxiety attacks recent  . GERD (gastroesophageal reflux disease)     hx  . Breast cancer ([place]) 2000    Previous Right breast cancer 2000, Lumpectomy, rad tx  . Recurrent breast cancer ([place]) 2013    Bilateral Mastectomies  . Renal cell adenocarcinoma ([place]) 07/23/2015   Social History   Social History  . Marital Status: Married    Spouse Name: N/A  . Number of Children: N/A  . Years of Education: N/A   Social History Main Topics  . Smoking status: Former Smoker -- 0.50 packs/day for 39 years    Types: Cigarettes    Quit date: 03/27/2015  . Smokeless tobacco: Never Used     Comment: social drinker  . Alcohol Use: No  . Drug Use: No  . Sexual Activity: Yes   Other Topics Concern  . None   Social History Narrative   Family History  Problem Relation Age of Onset  . Lung cancer Paternal Aunt   . Hypothyroidism Sister   . Heart disease Mother    Scheduled Meds: . antiseptic oral rinse  7 mL Mouth Rinse q12n4p  . chlorhexidine  15 mL Mouth Rinse BID  . dexamethasone  4 mg Oral TID  . enoxaparin (LOVENOX) injection  40 mg Subcutaneous Q24H  . fentaNYL  25 mcg Transdermal Q72H  . gabapentin  600 mg Oral BID  . levETIRAcetam  500 mg  Oral BID  . loratadine  10 mg Oral Daily  . rosuvastatin  10 mg Oral Daily   Continuous Infusions: . sodium chloride 125 mL/hr at 08/22/15 0606   PRN Meds:.acetaminophen **OR** acetaminophen, morphine injection, ondansetron **OR** ondansetron (ZOFRAN) IV Medications Prior to Admission:  Prior to Admission medications   Medication Sig Start Date End Date Taking? Authorizing Provider  cetirizine (ZYRTEC) 10 MG tablet Take 10 mg by mouth daily.    Historical Provider, MD  dexamethasone (DECADRON) 4 MG tablet Take 1 tablet (4 mg total) by mouth 3 (three) times daily. 07/18/15   Lloyd Huger, MD  fentaNYL (DURAGESIC - DOSED MCG/HR) 25 MCG/HR patch Place 1 patch (25 mcg total) onto the skin every 3 (three)  days. 07/23/15   Lloyd Huger, MD  gabapentin (NEURONTIN) 600 MG tablet Take 600 mg by mouth 2 (two) times daily. Reported on 07/29/2015    Historical Provider, MD  HYDROcodone-acetaminophen (NORCO/VICODIN) 5-325 MG tablet Take 1 tablet by mouth every 6 (six) hours as needed for moderate pain. 07/08/15   Evlyn Kanner, NP  levETIRAcetam (KEPPRA) 500 MG tablet Take 1 tablet (500 mg total) by mouth 2 (two) times daily. 07/26/15   Lloyd Huger, MD  lisinopril-hydrochlorothiazide (PRINZIDE,ZESTORETIC) 10-12.5 MG per tablet Take 1 tablet by mouth daily. Reported on 07/29/2015    Historical Provider, MD  LORazepam (ATIVAN) 0.5 MG tablet  08/01/15   Historical Provider, MD  Morphine Sulfate (MORPHINE CONCENTRATE) 10 mg / 0.5 ml concentrated solution  08/01/15   Historical Provider, MD  ondansetron (ZOFRAN) 8 MG tablet Take 1 tablet (8 mg total) by mouth every 8 (eight) hours as needed for nausea or vomiting. 07/03/15   Evlyn Kanner, NP  promethazine (PHENERGAN) 25 MG suppository Place 1 suppository (25 mg total) rectally every 6 (six) hours as needed for nausea or vomiting. 07/19/15   Wandra Arthurs, MD  psyllium (METAMUCIL SMOOTH TEXTURE) 28 % packet Take 1 packet by mouth 2 (two) times daily.  11/25/14   Dorie Rank, MD  rosuvastatin (CRESTOR) 10 MG tablet Take 10 mg by mouth daily. Reported on 07/29/2015    Historical Provider, MD  tamoxifen (NOLVADEX) 20 MG tablet Reported on 07/29/2015 06/20/15   Historical Provider, MD  VOTRIENT 200 MG tablet Reported on 07/29/2015 07/16/15   Historical Provider, MD   Allergies  Allergen Reactions  . Penicillins Anaphylaxis, Hives, Itching and Other (See Comments)    [place], itching, hives, throat closing Did PCN reaction causing immediate rash, facial/tongue/throat swelling, SOB or lightheadedness with hypotension: Yes Did PCN reaction causing severe rash involving mucus membranes or skin necrosis: Yes Has patient had a PCN reaction that required hospitalization Yes- went to urgent care Has patient had a PCN reaction occurring within the last 10 years: No- happened in the 90's If all of the above answers are "NO", then may proceed with Cephalosporin use.   . Aspirin Nausea And Vomiting and Other (See Comments)    Upset stomach  . Metformin And Related Diarrhea    Tired, stomach was jumping flips, making me feel bad   Review of Systems  Constitutional: Positive for fatigue.  HENT: Positive for trouble swallowing.   Neurological: Positive for weakness.    Physical Exam  Constitutional: She appears well-developed. She is cooperative. She appears ill.  HENT:  Mouth/Throat: Oropharynx is clear and moist.  Cardiovascular: Tachycardia present.   Pulmonary/Chest: She has decreased breath sounds.  Neurological: She is alert.  Skin: Skin is warm and dry.    Vital Signs: BP 130/66 mmHg  Pulse 102  Temp(Src) 98.7 F (37.1 C) (Oral)  Resp 20  Ht 4\' 11"  (1.499 m)  Wt 50.775 kg (111 lb 15 oz)  BMI 22.60 kg/m2  SpO2 98% Pain Assessment: No/denies pain POSS *See Group Information*: S-Acceptable,Sleep, easy to arouse Pain Score: 0-No pain   SpO2: SpO2: 98 % O2 Device:SpO2: 98 % O2 Flow Rate: .O2 Flow Rate (L/min): 2 L/min  IO: Intake/output  summary: No intake or output data in the 24 hours ending 08/22/15 1345  LBM:   Baseline Weight: Weight: 50.775 kg (111 lb 15 oz) Most recent weight: Weight: 50.775 kg (111 lb 15 oz)     Palliative Assessment/Data:   Flowsheet  Rows        Most Recent Value   Intake Tab    Referral Department  Hospitalist   Unit at Time of Referral  Oncology Unit   Palliative Care Primary Diagnosis  Cancer   Date Notified  08/21/15   Palliative Care Type  New Palliative care   Reason for referral  Clarify Goals of Care   Date of Admission  08/21/15   # of days IP prior to Palliative referral  0   Clinical Assessment    Psychosocial & Spiritual Assessment    Palliative Care Outcomes      Discussed with Dr Grayland Ormond and Dr Earleen Newport Time In: 1300 Time Out: 1415 Time Total: 75 min Greater than 50%  of this time was spent counseling and coordinating care related to the above assessment and plan.  Signed by: Wadie Lessen, NP   Please contact Palliative Medicine Team phone at [phone] for questions and concerns.  For individual provider: See Shea Evans

## 2015-08-22 NOTE — Care Management (Signed)
Admitted to [facility] with the diagnosis of altered mental status from the [place]. Lives with husband, Ceasar Lund [phone]). Primary care physician in [place] is [name]. Port has been placed.  Radiation was planned at the [facility] yesterday, but never started. Decreased po intake. Followed by Hospice and [place]. Wadie Lessen, NP member of [place] Team notified Odessa Fleming, representative of [place] of discharge plans for 08/23/15. Family would like Ms. Al Ameen to be discharged to home tomorrow with resumption of services.  Would like to be transported to home per EMS. Shelbie Ammons RN MSN CCM Care Management [phone]

## 2015-08-22 NOTE — Progress Notes (Unsigned)
08/21/15 Patient was admitted to the hospital.

## 2015-08-22 NOTE — Progress Notes (Signed)
[facility] [place]-GIP RN Visit  This is a related GIP admission from 08/21/15 to [facility] diagnosis of renal CA, per Dr. Tomasa Hosteller.  Patient admitted with failure to thrive and dysphagia.  She received her last radiation treatment yesterday, and after the treatment was weak with AMS.  Family reported a decrease in her po intake over the past couple weeks and reported she has been aspirating fluids.  Patient was brought to the hospital for further evaluation after an aspiration event with acute hypoxia at the cancer center.  Patient seen in the room, with her 3 sisters and sister-in-law at bedside.  Patient lethargic, oriented.  She is sitting up in bed, visiting with family.  Patient on 3L [place] with normal respirations and O2 sats at 100%.  She denies any pain.  Family verbalized concern over recent issues with patient aspirating.  They stated they were awaiting a swallow evaluation and recommendations from speech therapy.  Patient currently receiving IVF at a rate of 125 ML/hr via a PIV.  Family verbalized they are currently awaiting a meeting with PMT.  They denied any hospice needs and denied any further DME needs at home prior to discharge.  Spoke to RN about setting up suction in room d/t high aspiration risk.  Updated medication list and transfer summary placed on shadow chart.  [facility] will continue to follow and anticipate any discharge needs.  Thank you, Freddi Starr RN, [facility] Liaison [phone]

## 2015-08-22 NOTE — Evaluation (Signed)
Clinical/Bedside Swallow Evaluation Patient Details  Name: Kerri Carpenter MRN: [id number] Date of Birth: Mar 23, 1955  Today's Date: 08/22/2015 Time: SLP Start Time (ACUTE ONLY): 0930 SLP Stop Time (ACUTE ONLY): 1030 SLP Time Calculation (min) (ACUTE ONLY): 60 min  Past Medical History:  Past Medical History  Diagnosis Date  . Hypertension   . Anxiety     anxiety attacks recent  . GERD (gastroesophageal reflux disease)     hx  . Breast cancer ([place]) 2000    Previous Right breast cancer 2000, Lumpectomy, rad tx  . Recurrent breast cancer ([place]) 2013    Bilateral Mastectomies  . Renal cell adenocarcinoma ([place]) 07/23/2015   Past Surgical History:  Past Surgical History  Procedure Laterality Date  . Abdominal hysterectomy    . Breast lumpectomy  2000  . Axillary lymph node dissection  02/02/2012    Procedure: AXILLARY LYMPH NODE DISSECTION;  Surgeon: [name], MD;  Location: [place];  Service: General;  Laterality: Right;  Bilateral mastectomy; left axillary sentinel node biopsy; right axillary lymph node dissection.   . Total mastectomy  02/02/2012    Procedure: TOTAL MASTECTOMY;  Surgeon: [name], MD;  Location: [place] [facility] [place];  Service: General;  Laterality: Bilateral;  Bilateral mastectomy; left axillary sentinel node biopsy; right axillary lymph node dissection.   Marland Kitchen Axillary sentinel node biopsy  02/02/2012    Procedure: AXILLARY SENTINEL NODE BIOPSY;  Surgeon: [name], MD;  Location: [place];  Service: General;  Laterality: Left;  Bilateral mastectomy; left axillary sentinel node biopsy; right axillary lymph node dissection.    HPI:  Pt is a 60 y.o. female with a known history of Hypertension, anxiety, GERD, history of breast cancer, advanced renal cell carcinoma who presents to the hospital due to weakness, poor by mouth intake and progressive decline over the past few weeks. Per report, pt became choked when attempting to drink water at her tx at the [facility];  family stated she was using a straw and was quite weak.    Assessment / Plan / Recommendation Clinical Impression  Pt appeared to adequate tolerate trials of thin liquids and puree/soft solid trial(moistened well and cut small) w/ no immediate, overt s/s of aspiration noted; no decline in respiratory status or change in vocal quality observed. Oral phase was grossly wfl as pt continued w/ po trials; initially pt exhibited a slower oral phase as she prepared for swallowing. Oral clearing was appropriate w/ all trials. Pt helped to feed self holding her cup when drinking(safer for intake and swallowing). Pt appeared weak and would need increased assistance w/ po/feeding at meals to conserve her energy and increase safety w/ oral intake. Pt appears at reduced risk for aspiration following general aspiration precautions. Recommend a soft foods diet; family agreed w/ a Dysphagia level 2(chopped foods) w/ thin liquids via CUP - NO Straws. General aspiration precautions; meds in puree. NSG to monitor at meals as needed; family present. ST will be available for any further education as needed while admitted.     Aspiration Risk   (reduced)    Diet Recommendation  Dysphagia 2 w/ thin liquids; moistened foods cut small; can upgrade food consistency as tolerates. General aspiration precautions. Assistance at meals to conserve energy. NO STRAWS w/ drinks.   Medication Administration: Whole meds with puree (crushed if needed/able to)    Other  Recommendations Recommended Consults:  (Dietician following) Oral Care Recommendations: Oral care BID;Staff/trained caregiver to provide oral care   Follow up  Recommendations  None    Frequency and Duration            Prognosis Prognosis for Safe Diet Advancement: Good Barriers to Reach Goals:  (deconditioned )      Swallow Study   General Date of Onset: 08/21/15 HPI: Pt is a 60 y.o. female with a known history of Hypertension, anxiety, GERD, history of breast  cancer, advanced renal cell carcinoma who presents to the hospital due to weakness, poor by mouth intake and progressive decline over the past few weeks. Per report, pt became choked when attempting to drink water at her tx at the [facility]; family stated she was using a straw and was quite weak.  Type of Study: Bedside Swallow Evaluation Previous Swallow Assessment: none Diet Prior to this Study: Dysphagia 3 (soft);Thin liquids (described) Temperature Spikes Noted: No (wbc elevated 14.8) Respiratory Status: Nasal cannula (2-3 liters) History of Recent Intubation: No Behavior/Cognition: Alert;Cooperative;Pleasant mood;Requires cueing Oral Cavity Assessment: Dry Oral Care Completed by SLP: Recent completion by staff Oral Cavity - Dentition: Missing dentition Vision: Functional for self-feeding Self-Feeding Abilities: Needs assist;Needs set up;Total assist Patient Positioning: Upright in bed Baseline Vocal Quality: Low vocal intensity Volitional Cough: Strong Volitional Swallow: Able to elicit    Oral/Motor/Sensory Function     Ice Chips Ice chips: Within functional limits Presentation: Spoon (fed; 3 trials)   Thin Liquid Thin Liquid: Within functional limits Presentation: Self Fed;Cup (8 trials)    Nectar Thick Nectar Thick Liquid: Not tested   Honey Thick Honey Thick Liquid: Not tested   Puree Puree: Within functional limits Presentation: Spoon (fed; 8 trials)   Solid   GO   Solid: Within functional limits (softemed, moistened 1 trial) Presentation: Spoon (fed)         Orinda Kenner, MS, CCC-SLP  [name] 08/22/2015,4:54 PM

## 2015-08-23 DIAGNOSIS — E860 Dehydration: Secondary | ICD-10-CM | POA: Diagnosis not present

## 2015-08-23 NOTE — Discharge Summary (Signed)
[facility] NAME: Kerri Carpenter    MR#:  [id number]  DATE OF BIRTH:  May 07, 1955  DATE OF ADMISSION:  08/21/2015 ADMITTING PHYSICIAN: Theodoro Grist, MD  DATE OF DISCHARGE: 08/23/2015  PRIMARY CARE PHYSICIAN: Velna Hatchet, MD    ADMISSION DIAGNOSIS:  Renal cell cancer stage 4  DISCHARGE DIAGNOSIS:  Active Problems:   Altered mental status   DNR (do not resuscitate)   Palliative care encounter   Weakness generalized   SECONDARY DIAGNOSIS:   Past Medical History  Diagnosis Date  . Hypertension   . Anxiety     anxiety attacks recent  . GERD (gastroesophageal reflux disease)     hx  . Breast cancer ([place]) 2000    Previous Right breast cancer 2000, Lumpectomy, rad tx  . Recurrent breast cancer ([place]) 2013    Bilateral Mastectomies  . Renal cell adenocarcinoma ([place]) 07/23/2015    HOSPITAL COURSE:   1. Acute encephalopathy and altered mental status. Likely secondary to dehydration. 2. Dehydration secondary to poor oral intake. Patient was given IV fluid hydration during the hospital course. 3. Dysphasia likely related to underlying malignancy. Patient on dysphagia 2 diet 4. Advanced renal cell carcinoma. Patient will go home with hospice and patient is a DO NOT RESUSCITATE  5. 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 Patient on Keppra. 6. Chronic pain on fentanyl patch, Roxanol at home 7. Hyperlipidemia unspecified stop Crestor 8. Tachycardia likely dehydration related  DISCHARGE CONDITIONS:   Guarded  CONSULTS OBTAINED:  Oncology Palliative care  DRUG ALLERGIES:   Allergies  Allergen Reactions  . Penicillins Anaphylaxis, Hives, Itching and Other (See Comments)    Whelps, itching, hives, throat closing Did PCN reaction causing immediate rash, facial/tongue/throat swelling, SOB or lightheadedness with hypotension: Yes Did PCN reaction causing severe rash involving mucus membranes or skin necrosis:  Yes Has patient had a PCN reaction that required hospitalization Yes- went to urgent care Has patient had a PCN reaction occurring within the last 10 years: No- happened in the 90's If all of the above answers are "NO", then may proceed with Cephalosporin use.   . Aspirin Nausea And Vomiting and Other (See Comments)    Upset stomach  . Metformin And Related Diarrhea    Tired, stomach was jumping flips, making me feel bad    DISCHARGE MEDICATIONS:   Current Discharge Medication List    CONTINUE these medications which have NOT CHANGED   Details  cetirizine (ZYRTEC) 10 MG tablet Take 10 mg by mouth daily.    fentaNYL (DURAGESIC - DOSED MCG/HR) 25 MCG/HR patch Place 1 patch (25 mcg total) onto the skin every 3 (three) days. Qty: 10 patch, Refills: 0   Associated Diagnoses: Renal cell carcinoma, unspecified laterality (HCC)    gabapentin (NEURONTIN) 600 MG tablet Take 600 mg by mouth 2 (two) times daily. Reported on 07/29/2015    HYDROcodone-acetaminophen (NORCO/VICODIN) 5-325 MG tablet Take 1 tablet by mouth every 6 (six) hours as needed for moderate pain. Qty: 60 tablet, Refills: 0    LORazepam (ATIVAN) 0.5 MG tablet 0.5 mg every 6 (six) hours as needed.     Morphine Sulfate (MORPHINE CONCENTRATE) 10 mg / 0.5 ml concentrated solution Take 5 mg by mouth every 4 (four) hours as needed.     promethazine (PHENERGAN) 25 MG suppository Place 1 suppository (25 mg total) rectally every 6 (six) hours as needed for nausea or vomiting. Qty: 10 suppository, Refills: 0  psyllium (METAMUCIL SMOOTH TEXTURE) 28 % packet Take 1 packet by mouth 2 (two) times daily. Qty: 30 packet, Refills: 1    dexamethasone (DECADRON) 4 MG tablet Take 1 tablet (4 mg total) by mouth 3 (three) times daily. Qty: 30 tablet, Refills: 2    levETIRAcetam (KEPPRA) 500 MG tablet Take 1 tablet (500 mg total) by mouth 2 (two) times daily. Qty: 30 tablet, Refills: 2    ondansetron (ZOFRAN) 8 MG tablet Take 1 tablet (8  mg total) by mouth every 8 (eight) hours as needed for nausea or vomiting. Qty: 30 tablet, Refills: 0      STOP taking these medications     lisinopril-hydrochlorothiazide (PRINZIDE,ZESTORETIC) 10-12.5 MG per tablet      rosuvastatin (CRESTOR) 10 MG tablet      tamoxifen (NOLVADEX) 20 MG tablet      VOTRIENT 200 MG tablet          DISCHARGE INSTRUCTIONS:   Follow-up with hospice at home  If you experience worsening of your admission symptoms, develop shortness of breath, life threatening emergency, suicidal or homicidal thoughts you must seek medical attention immediately by calling 911 or calling your MD immediately  if symptoms less severe.  You Must read complete instructions/literature along with all the possible adverse reactions/side effects for all the Medicines you take and that have been prescribed to you. Take any new Medicines after you have completely understood and accept all the possible adverse reactions/side effects.   Please note  You were cared for by a hospitalist during your hospital stay. If you have any questions about your discharge medications or the care you received while you were in the hospital after you are discharged, you can call the unit and asked to speak with the hospitalist on call if the hospitalist that took care of you is not available. Once you are discharged, your primary care physician will handle any further medical issues. Please note that NO REFILLS for any discharge medications will be authorized once you are discharged, as it is imperative that you return to your primary care physician (or establish a relationship with a primary care physician if you do not have one) for your aftercare needs so that they can reassess your need for medications and monitor your lab values.    Today   CHIEF COMPLAINT:  Brought in with altered mental status  HISTORY OF PRESENT ILLNESS:  Kerri Carpenter  is a 60 y.o. 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 She presented with altered mental status   VITAL SIGNS:  Blood pressure 115/73, pulse 89, temperature 97.6 F (36.4 C), temperature source Oral, resp. rate 20, height 4\' 11"  (1.499 m), weight 50.775 kg (111 lb 15 oz), SpO2 100 %.    PHYSICAL EXAMINATION:  GENERAL:  60 y.o.-year-old patient lying in the bed with no acute distress, looking acutely ill.  EYES: Pupils equal, round, reactive to light and accommodation. No scleral icterus. Extraocular muscles intact.  HEENT: Head atraumatic, normocephalic. Oropharynx and nasopharynx clear.  NECK:  Supple, no jugular venous distention. No thyroid enlargement, no tenderness.  LUNGS: Decreased  breath sounds bilaterally, no wheezing, rales,rhonchi or crepitation. No use of accessory muscles of respiration.  CARDIOVASCULAR: S1, S2 normal. No murmurs, rubs, or gallops.  ABDOMEN: Soft, non-tender, non-distended. Bowel sounds present. No organomegaly or mass.  EXTREMITIES: No pedal edema, cyanosis, or clubbing.  NEUROLOGIC: Cranial nerves II through XII are intact. Sensation intact. Gait not checked.  PSYCHIATRIC: The patient is alert and oriented x 3.  SKIN: No obvious rash, lesion, or ulcer.   DATA REVIEW:   CBC  Recent Labs Lab 08/22/15 0326  WBC 14.8*  HGB 11.3*  HCT 36.8  PLT 637*    Chemistries   Recent Labs Lab 08/22/15 0326  Kerri 138  K 4.6  CL 106  CO2 21*  GLUCOSE 151*  BUN 25*  CREATININE 0.75  CALCIUM 8.4*  AST 32  ALT 19  ALKPHOS 241*  BILITOT <0.1*    Management plans discussed with the patient, family and they are in agreement.  CODE STATUS:     Code Status Orders        Start     Ordered   08/22/15 1353  Do not attempt resuscitation (DNR)   Continuous    Question Answer Comment  In the event of cardiac or respiratory ARREST Do not call a "code blue"   In the event of cardiac or respiratory ARREST Do not perform Intubation, CPR, defibrillation or ACLS    In the event of cardiac or respiratory ARREST Use medication by any route, position, wound care, and other measures to relive pain and suffering. May use oxygen, suction and manual treatment of airway obstruction as needed for comfort.      08/22/15 1352    Code Status History    Date Active Date Inactive Code Status Order ID Comments User Context   08/21/2015  2:11 PM 08/22/2015  1:52 PM Full Code [id number]  Henreitta Leber, MD Inpatient   02/02/2012 12:42 PM 02/03/2012  2:30 PM Full Code [id number]  Courtney Heys, RN Inpatient    Advance Directive Documentation        Most Recent Value   Type of Advance Directive  Healthcare Power of Attorney, Living will   Pre-existing out of facility DNR order (yellow form or pink MOST form)     "MOST" Form in Place?        TOTAL TIME TAKING CARE OF THIS PATIENT: 35 minutes.    Loletha Grayer M.D on 08/23/2015 at 12:52 PM  Between 7am to 6pm - Pager - [phone]  After 6pm go to [email or web address] - password [facility]  [phone]  CC: Primary care physician; Velna Hatchet, MD

## 2015-08-23 NOTE — Care Management (Signed)
EMS packet completed. I have faxed orders to [facility] hospice. They are aware that patient is discharging to home today. No further RNCM needs.

## 2015-08-23 NOTE — Plan of Care (Signed)
Problem: Activity: Goal: Risk for activity intolerance will decrease Outcome: Progressing Patient up to [facility] this shift with assistance.

## 2015-08-23 NOTE — Care Management (Signed)
Spoke with [facility] to update  [phone] fax [phone])  (spoke with [facility]). She would like discharge notes to be faxed to them when patient discharges. EMS packet started; DNR signature requested for packet.

## 2015-08-23 NOTE — Progress Notes (Signed)
MD making rounds. Discharge orders received. IV discontinued. Transfer Home with Hospice being facilitated.Discharge paperwork provided, explained, signed and witnessed. No unanswered questions. EMS called for transport. EMS on Unit for transport. Transported via EMS. Belongings sent with family.

## 2015-08-28 ENCOUNTER — Telehealth: Payer: Self-pay | Admitting: *Deleted

## 2015-08-28 NOTE — Telephone Encounter (Signed)
Bill from Hospice called to notify office that patient has been moved to [facility] for end of life care.

## 2015-08-29 DIAGNOSIS — J9859 Other diseases of mediastinum, not elsewhere classified: Secondary | ICD-10-CM | POA: Insufficient documentation

## 2015-09-05 ENCOUNTER — Ambulatory Visit: Payer: Managed Care, Other (non HMO) | Admitting: Radiation Oncology

## 2015-09-05 ENCOUNTER — Inpatient Hospital Stay
Admission: RE | Admit: 2015-09-05 | Payer: Managed Care, Other (non HMO) | Source: Ambulatory Visit | Admitting: Radiation Oncology

## 2015-09-05 ENCOUNTER — Other Ambulatory Visit: Payer: Self-pay | Admitting: Oncology

## 2015-09-24 DEATH — deceased

## 2015-09-25 ENCOUNTER — Ambulatory Visit: Payer: Managed Care, Other (non HMO) | Admitting: Radiation Oncology

## 2015-12-09 ENCOUNTER — Ambulatory Visit: Payer: Managed Care, Other (non HMO) | Admitting: Oncology

## 2018-03-05 IMAGING — CR DG ABDOMEN ACUTE W/ 1V CHEST
3 series · 3 of 3 positions shown · non-contrast
Comparison: Abdominal CT from yesterday

CLINICAL DATA: Abdominal pain.

EXAM:
DG ABDOMEN ACUTE W/ 1V CHEST

[chest pa]
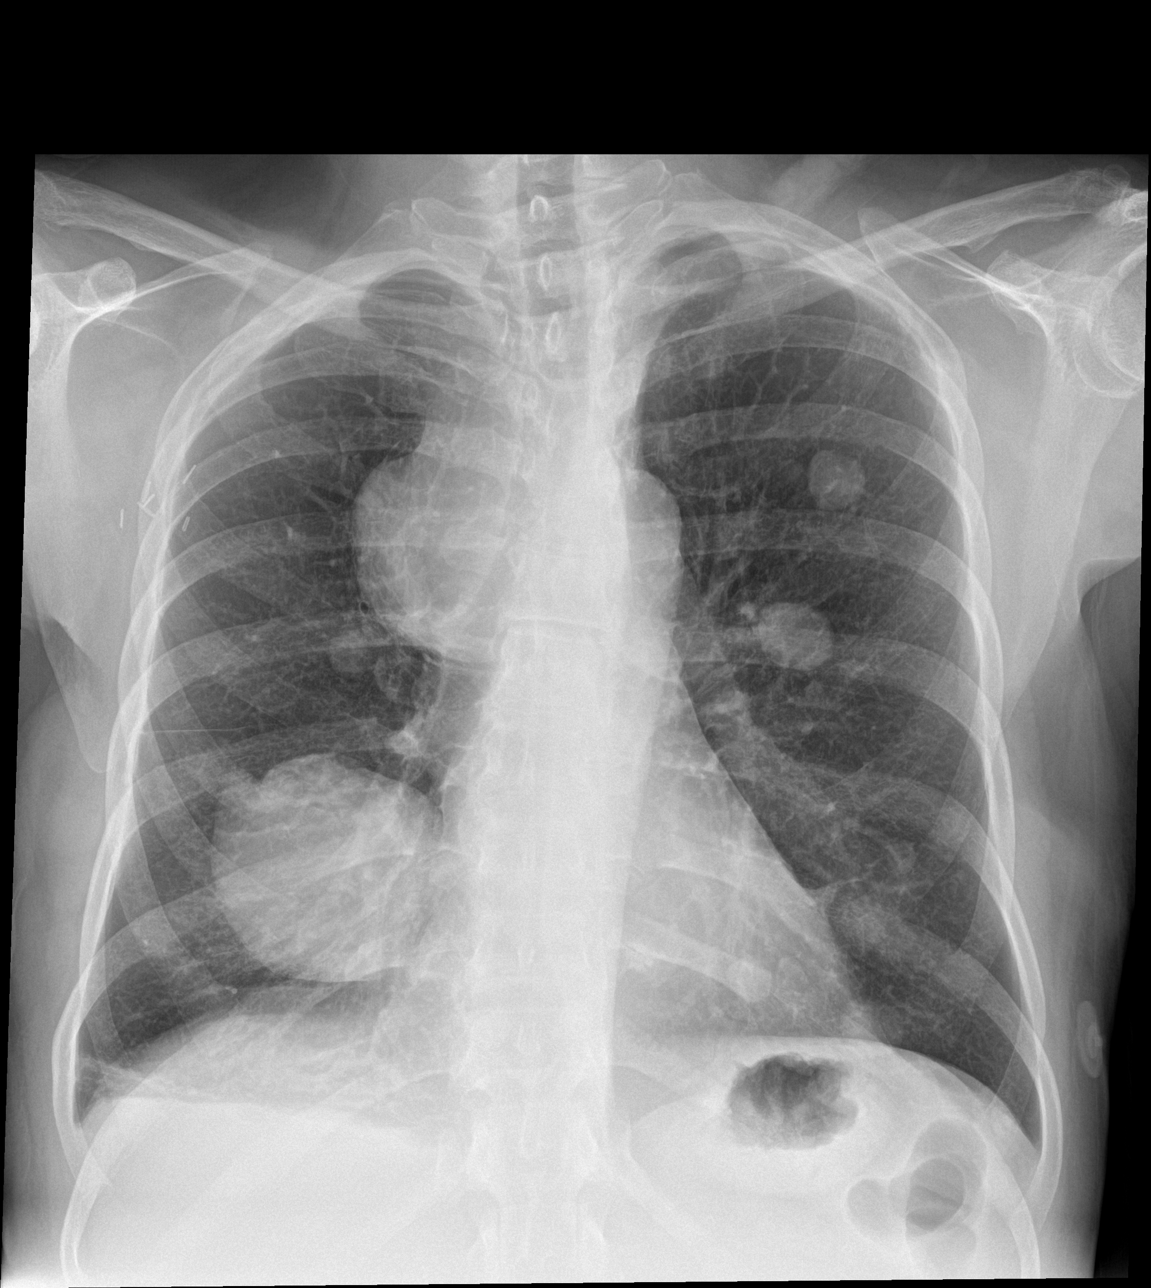

[abdomen erect]
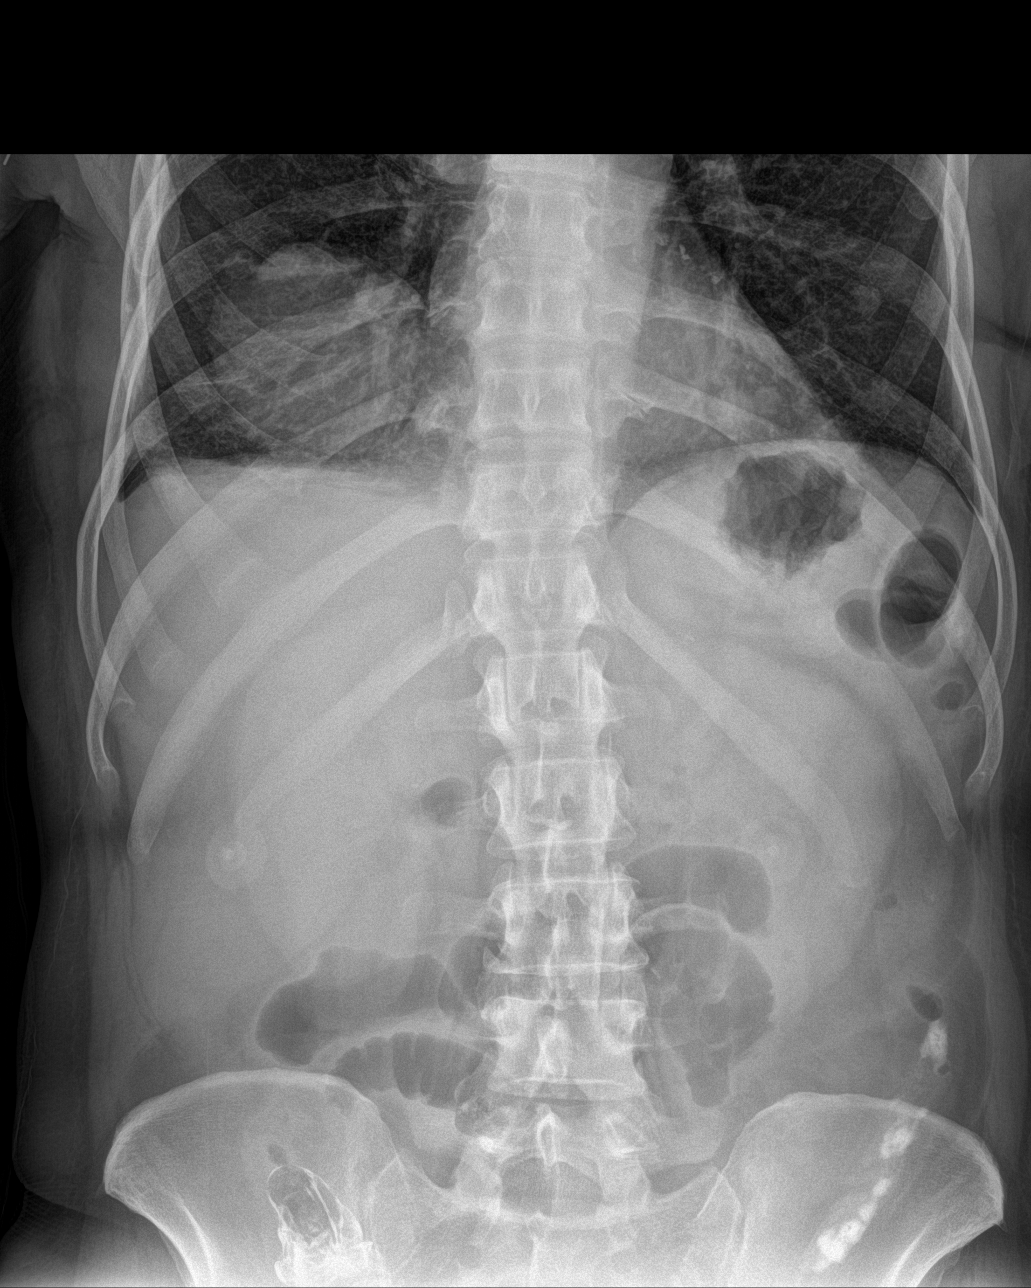

[abdomen supine]
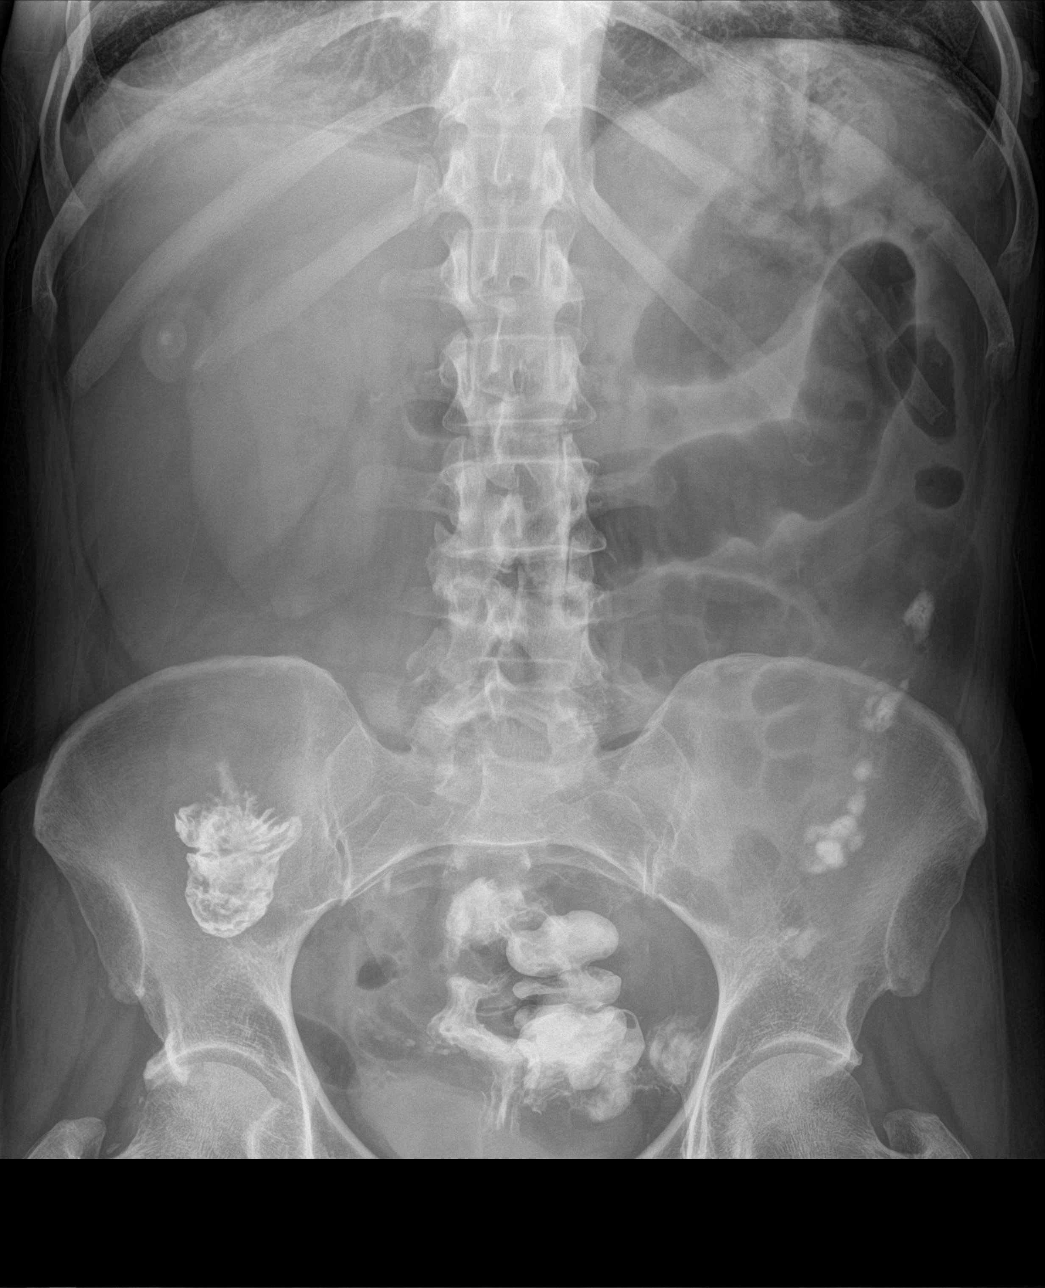

[3 of 3 positions shown; findings below may reference images not displayed]

FINDINGS: Oral contrast from yesterday CTA has progressed throughout the
colon. Few distended loops of small bowel in the central abdomen but
no generalized obstructive pattern. No concerning intra-abdominal
mass effect or calcification.

Hyperinflated lungs with known metastases (Breast cancer) and small
right effusion. Normal heart size.
IMPRESSION: 1. Nonobstructive bowel gas pattern. Oral contrast from yesterday
has progressed to the distal colon.
2. COPD and intrathoracic metastases. No acute superimposed finding.
